# Patient Record
Sex: Female | Born: 1970
Health system: Southern US, Community
[De-identification: ages and names within clinical notes are randomized; demographics above are authoritative.]

## PROBLEM LIST (undated history)

## (undated) DIAGNOSIS — D649 Anemia, unspecified: Secondary | ICD-10-CM

## (undated) DIAGNOSIS — M1990 Unspecified osteoarthritis, unspecified site: Secondary | ICD-10-CM

## (undated) DIAGNOSIS — Z973 Presence of spectacles and contact lenses: Secondary | ICD-10-CM

## (undated) DIAGNOSIS — I10 Essential (primary) hypertension: Secondary | ICD-10-CM

## (undated) HISTORY — DX: Anemia, unspecified: D64.9

---

## 2008-09-17 HISTORY — PX: FOOT SURGERY: SHX648

## 2013-02-03 ENCOUNTER — Ambulatory Visit: Payer: Self-pay | Admitting: Family Medicine

## 2015-02-01 ENCOUNTER — Ambulatory Visit
Admission: EM | Admit: 2015-02-01 | Discharge: 2015-02-01 | Disposition: A | Discharge status: Home / Self Care | Payer: BLUE CROSS/BLUE SHIELD | Attending: Family Medicine | Admitting: Family Medicine

## 2015-02-01 DIAGNOSIS — S76112A Strain of left quadriceps muscle, fascia and tendon, initial encounter: Secondary | ICD-10-CM | POA: Diagnosis not present

## 2015-02-01 MED ORDER — IBUPROFEN 800 MG PO TABS
800.0000 mg | ORAL_TABLET | Freq: Once | ORAL | Status: AC
  Administered 2015-02-01: 800 mg via ORAL

## 2015-02-01 NOTE — ED Notes (Signed)
Pt states "my left knee has been hurting for three weeks. No known injury."

## 2015-02-01 NOTE — ED Provider Notes (Signed)
CSN: [id number]     Arrival date & time 02/01/15  1516 History   First MD Initiated Contact with Patient 02/01/15 1634     Chief Complaint  Patient presents with  . Knee Pain   (Consider location/radiation/quality/duration/timing/severity/associated sxs/prior Treatment) Patient is a 44 y.o. female presenting with knee pain and leg pain.  Knee Pain Associated symptoms: swelling   Associated symptoms: no muscle weakness   Leg Pain Location:  Leg Time since incident:  3 weeks Leg location:  L leg and L upper leg Pain details:    Quality:  Aching and cramping   Radiates to:  Does not radiate   Severity:  Mild   Onset quality:  Gradual   Duration:  3 weeks   Timing:  Intermittent   Progression:  Waxing and waning Chronicity:  New Dislocation: no   Foreign body present:  No foreign bodies Prior injury to area:  No Relieved by:  Nothing Worsened by:  Activity Associated symptoms: swelling   Associated symptoms: no muscle weakness   Risk factors: obesity    Overweight F concerned about left thigh pain present for the past three weeks. She has been working in [employer] and had increased squats and weights when she noticed aching in left anterior thigh. Continued to work out and the discomfort has continued. Uses an ocassional ibuprofen- once or twice a day but becoming anxious that it has not resolved. No fall, no trauma per patient.   History reviewed. No pertinent past medical history. History reviewed. No pertinent past surgical history. Family History  Problem Relation Age of Onset  . Diabetes Mother   . Hypertension Father    History  Substance Use Topics  . Smoking status: Former Smoker    Quit date: 01/31/2010  . Smokeless tobacco: Not on file  . Alcohol Use: Yes   OB History    Gravida Para Term Preterm AB TAB SAB Ectopic Multiple Living   3              Review of Systems  HENT: Negative.   Eyes: Negative.   Respiratory: Negative.  Negative for shortness of  breath.   Cardiovascular: Negative.   Gastrointestinal: Negative.   Endocrine: Negative.   Genitourinary: Negative.   Musculoskeletal: Positive for myalgias and gait problem.  Skin: Negative.  Negative for color change and wound.  Allergic/Immunologic: Negative.   Neurological: Negative.   Hematological: Negative.   Psychiatric/Behavioral: Negative.   All other systems reviewed and are negative.   Allergies  Review of patient's allergies indicates no known allergies.  Home Medications   Prior to Admission medications   Not on File   BP 140/90 mmHg  Pulse 78  Temp(Src) 97.3 F (36.3 C) (Tympanic)  Resp 16  Ht 5\' 5"  (1.651 m)  Wt 211 lb (95.709 kg)  BMI 35.11 kg/m2  SpO2 100%  LMP 01/16/2015 (Exact Date) Physical Exam  Constitutional:    Obese  F in accelerating exercise program.Left thigh discomfort- not knee as presented.  No ecchymosis, no heat. DTRS equal. Can toe walk, heel walk. Squat and return recreates the discomfort  HENT:  Head: Atraumatic.  Eyes: EOM are normal.  Neck: Normal range of motion. Neck supple.  Cardiovascular: Normal rate and regular rhythm.   Pulmonary/Chest: No respiratory distress.  Musculoskeletal: Normal range of motion. She exhibits tenderness.  See previous notes- left anterior thigh-strain  Neurological: She is alert.  Skin: Skin is warm and dry.    ED Course  Procedures (including critical care time) Labs Review Labs Reviewed - No data to display  Imaging Review No results found.   MDM   1. Quadriceps strain, left, initial encounter    Supportive ace wrap applied by myself and figure 8 technique taught to patient-recognizing that anatomical shape of thigh  ( 5'5"  221 ) will tend toward slippage. May use snug- not tight - if it supports and given comfort while healing. Given informational handout on Quad strain and rehab exercises.  Encourage athletic rubs for comfort- ice after gentle exercise and heat pad in the evenings  at rest. May use OTC ibuprofen more regularly as well.  If not resolved with self care in 2-3 weeks recommend seen  be seen back or consult Ortho.  Discussed results,diagnosis and treatment  plan with patient and she expresses understanding.    Jan Fireman, PA-C 02/01/15 Noble Keating, PA-C 02/01/15 Joen Laura

## 2015-02-01 NOTE — Discharge Instructions (Signed)
Please use ibuprofen 200  mg 2 tablets 3 x day with meals - or alternate with acetominophen /tylenol  500 mg 2 tablets - ice thigh after activity - use heat pad when resting at the end of the day. An athletic rub of choice is available over the counter at most pharmacies and discount stores- those with menthol usually smell like peppermint- some have Capsasian as an ingredient and they heat with hot pepper juice oils usually do not smell like the gym. Advance as you are comfortable- do not push to point of pain. Please review the handout and exercises I gave you and make gentle progress. The information below you can increase in repetitions and time slowly as you get stronger.  If you have not had relief in a month of gentle progressive care I recommend evaluation with Orthopedics of choice- see the [employer] list in your insurance company information area on computer. Please establish yourself with a PCP in the area so you have a home base for care. Please return to our care if you have questions or concerns.. You may use the Quad wrap with 6 " ace bandages as I taught you if you find it comfortable and supportive-not required- only if you like it ! Thank you for choosing [place] for your care today !  Quadriceps Strain with Rehab A strain is a tear in a muscle or the tendon that attaches the muscle to bone. A quadriceps strain is a tear in the muscles on the front of the thigh (quadriceps muscles) or their tendons. The quadriceps muscles are important for straightening the knee and bending the hip. The condition is characterized by pain, inflammation, and reduced function of these muscles. Strains are classified into three categories. Grade 1 strains cause pain, but the tendon is not lengthened. Grade 2 strains include a lengthened ligament due to the ligament being stretched or partially ruptured. With grade 2 strains there is still function, although the function may be diminished. Grade 3 strains are  characterized by a complete tear of the tendon or muscle, and function is usually impaired.  SYMPTOMS   Pain, tenderness, inflammation, and/or bruising (contusion) over the quadriceps muscles  Pain that worsens with use of the quadriceps muscles.  Muscle spasm in the thigh.  Difficulty with common tasks that involve the quadriceps muscle, such as walking.  A crackling sound (crepitation) when the tendon is moved or touched.  Loss of fullness of the muscle or bulging within the area of muscle with complete rupture. CAUSES  A strain occurs when a force is placed on the muscle or tendon that is greater than it can withstand. Common mechanisms of injury include:  Repetitive strenuous use of the quadriceps muscles. This may be due to an increase in the intensity, frequency, or duration of exercise.  Direct trauma to the quadriceps muscles or tendons. RISK INCREASES WITH:  Activities that involve forceful contractions of the quadriceps muscles (jumping or sprinting).  Contact sports (soccer or football).  Poor strength and flexibility.  Failure to warm-up properly before activity.  Previous injury to the thigh or knee. PREVENTION  Warm up and stretch properly before activity.  Allow for adequate recovery between workouts.  Maintain physical fitness:  Strength, flexibility, and endurance.  Cardiovascular fitness.  Wear properly fitted and padded protective equipment. PROGNOSIS  If treated properly, then quadriceps muscles strains are usually curable within 6 weeks.  RELATED COMPLICATIONS   Prolonged healing time, if improperly treated or re-injured.  Recurrent symptoms  that result in a chronic problem.  Recurrence of symptoms if activity is resumed too soon. TREATMENT  Treatment initially involves the use of ice and medication to help reduce pain and inflammation. The use of strengthening and stretching exercises may help reduce pain with activity. These exercises may  be performed at home or with referral to a therapist. Crutches may be recommended to allow the muscle to rest until walking can be completed without limping. Surgery is rarely necessary for this injury, but may be considered if the injury involves a grade 3 strain, or if symptoms persist for greater than 3 months despite non-surgical (conservative) treatment.  MEDICATION  If pain medication is necessary, then nonsteroidal anti-inflammatory medications, such as aspirin and ibuprofen, or other minor pain relievers, such as acetaminophen, are often recommended.  Do not take pain medication for 7 days before surgery.  Prescription pain relievers may be given if deemed necessary by your caregiver. Use only as directed and only as much as you need.  Ointments applied to the skin may be helpful.  Corticosteroid injections may be given by your caregiver. These injections should be reserved for the most serious cases, because they may only be given a certain number of times. HEAT AND COLD  Cold treatment (icing) relieves pain and reduces inflammation. Cold treatment should be applied for 10 to 15 minutes every 2 to 3 hours for inflammation and pain and immediately after any activity that aggravates your symptoms. Use ice packs or massage the area with a piece of ice (ice massage).  Heat treatment may be used prior to performing the stretching and strengthening activities prescribed by your caregiver, physical therapist, or athletic trainer. Use a heat pack or soak the injury in warm water. SEEK MEDICAL CARE IF:  Treatment seems to offer no benefit, or the condition worsens.  Any medications produce adverse side effects. EXERCISES  RANGE OF MOTION (ROM) AND STRETCHING EXERCISES - Quadriceps Strain These exercises may help you when beginning to rehabilitate your injury. Your symptoms may resolve with or without further involvement from your physician, physical therapist or athletic trainer. While  completing these exercises, remember:   Restoring tissue flexibility helps normal motion to return to the joints. This allows healthier, less painful movement and activity.  An effective stretch should be held for at least 30 seconds.  A stretch should never be painful. You should only feel a gentle lengthening or release in the stretched tissue. RANGE OF MOTION - Knee Flexion, Active  Lie on your back with both knees straight. (If this causes back discomfort, bend your opposite knee, placing your foot flat on the floor.)  Slowly slide your heel back toward your buttocks until you feel a gentle stretch in the front of your knee or thigh.  Hold for __________ seconds. Slowly slide your heel back to the starting position. Repeat __________ times. Complete this exercise __________ times per day.  STRETCH - Quadriceps, Prone  Lie on your stomach on a firm surface, such as a bed or padded floor.  Bend your right / left knee and grasp your ankle. If you are unable to reach, your ankle or pant leg, use a belt around your foot to lengthen your reach.  Gently pull your heel toward your buttocks. Your knee should not slide out to the side. You should feel a stretch in the front of your thigh and/or knee.  Hold this position for __________ seconds. Repeat __________ times. Complete this stretch __________ times per day.  STRETCHING - Hip Flexors, Lunge  Half kneel with your right / left knee on the floor and your opposite knee bent and directly over your ankle.  Keep good posture with your head over your shoulders. Tighten your buttocks to point your tailbone downward; this will prevent your back from arching too much.  You should feel a gentle stretch in the front of your thigh and/or hip. If you do not feel any resistance, slightly slide your opposite foot forward and then slowly lunge forward so your knee once again lines up over your ankle. Be sure your tailbone remains pointed  downward.  Hold this stretch for __________ seconds. Repeat __________ times. Complete this stretch __________ times per day. STRENGTHENING EXERCISES - Quadriceps Strain These exercises may help you when beginning to rehabilitate your injury. They may resolve your symptoms with or without further involvement from your physician, physical therapist or athletic trainer. While completing these exercises, remember:   Muscles can gain both the endurance and the strength needed for everyday activities through controlled exercises.  Complete these exercises as instructed by your physician, physical therapist or athletic trainer. Progress the resistance and repetitions only as guided. STRENGTH - Quadriceps, Isometrics  Lie on your back with your right / left leg extended and your opposite knee bent.  Gradually tense the muscles in the front of your right / left thigh. You should see either your knee cap slide up toward your hip or increased dimpling just above the knee. This motion will push the back of the knee down toward the floor/mat/bed on which you are lying.  Hold the muscle as tight as you can without increasing your pain for __________ seconds.  Relax the muscles slowly and completely in between each repetition. Repeat __________ times. Complete this exercise __________ times per day.  STRENGTH - Quadriceps, Short Arcs   Lie on your back. Place a __________ inch towel roll under your knee so that the knee slightly bends.  Raise only your lower leg by tightening the muscles in the front of your thigh. Do not allow your thigh to rise.  Hold this position for __________ seconds. Repeat __________ times. Complete this exercise __________ times per day.  OPTIONAL ANKLE WEIGHTS: Begin with ____________________, but DO NOT exceed ____________________. Increase in1 lb/0.5 kg increments. STRENGTH - Quadriceps, Straight Leg Raises  Quality counts! Watch for signs that the quadriceps muscle is  working to insure you are strengthening the correct muscles and not "cheating" by substituting with healthier muscles.  Lay on your back with your right / left leg extended and your opposite knee bent.  Tense the muscles in the front of your right / left thigh. You should see either your knee cap slide up or increased dimpling just above the knee. Your thigh may even quiver.  Tighten these muscles even more and raise your leg 4 to 6 inches off the floor. Hold for __________ seconds.  Keeping these muscles tense, lower your leg.  Relax the muscles slowly and completely in between each repetition. Repeat __________ times. Complete this exercise __________ times per day.  STRENGTH - Quadriceps, Wall Slides  Follow guidelines for form closely. Increased knee pain often results from poorly placed feet or knees.  Lean against a smooth wall or door and walk your feet out 18-24 inches. Place your feet hip-width apart.  Slowly slide down the wall or door until your knees bend __________ degrees.* Keep your knees over your heels, not your toes, and in line with  your hips, not falling to either side.  Hold for __________ seconds. Stand up to rest for __________ seconds in between each repetition. Repeat __________ times. Complete this exercise __________ times per day. * Your physician, physical therapist or athletic trainer will alter this angle based on your symptoms and progress. STRENGTH - Quadriceps, Step-Ups   Use a thick book, step or step stool that is __________ inches tall.  Holding a wall or counter for balance only, not support.  Slowly step-up with your right / left foot, keeping your knee in line with your hip and foot. Do not allow your knee to bend so far that you cannot see your toes.  Slowly unlock your knee and lower yourself to the starting position. Your muscles, not gravity, should lower you. Repeat __________ times. Complete this exercise __________ times per day. Document  Released: 09/03/2005 Document Revised: 11/26/2011 Document Reviewed: 12/16/2008 [facility] Patient Information 2015 [place], [place]. This information is not intended to replace advice given to you by your health care provider. Make sure you discuss any questions you have with your health care provider.

## 2015-05-18 ENCOUNTER — Ambulatory Visit
Admission: EM | Admit: 2015-05-18 | Discharge: 2015-05-18 | Disposition: A | Discharge status: Home / Self Care | Payer: BLUE CROSS/BLUE SHIELD | Attending: Family Medicine | Admitting: Family Medicine

## 2015-05-18 DIAGNOSIS — S0502XA Injury of conjunctiva and corneal abrasion without foreign body, left eye, initial encounter: Secondary | ICD-10-CM | POA: Diagnosis not present

## 2015-05-18 MED ORDER — TETRACAINE HCL 0.5 % OP SOLN: 2.0000 [drp] | Freq: Once | OPHTHALMIC | Status: DC

## 2015-05-18 MED ORDER — ERYTHROMYCIN 5 MG/GM OP OINT: 1.0000 "application " | TOPICAL_OINTMENT | Freq: Four times a day (QID) | OPHTHALMIC | Status: DC

## 2015-05-18 MED ORDER — FLUORESCEIN SODIUM 1 MG OP STRP: 1.0000 | ORAL_STRIP | Freq: Once | OPHTHALMIC | Status: DC

## 2015-05-18 NOTE — Discharge Instructions (Signed)
Use medication as prescribed. Use glasses. Do not use contacts until completed medication and improved. Avoid rubbing eyes. As discussed, follow up with your ophthalmologist tomorrow at [facility], or the above. Call today to schedule.   Return to Urgent Care or ER for increased irritation, eye pain, vision changes, new or worsening concerns.   Corneal Abrasion The cornea is the clear covering at the front and center of the eye. When looking at the colored portion of the eye (iris), you are looking through the cornea. This very thin tissue is made up of many layers. The surface layer is a single layer of cells (corneal epithelium) and is one of the most sensitive tissues in the body. If a scratch or injury causes the corneal epithelium to come off, it is called a corneal abrasion. If the injury extends to the tissues below the epithelium, the condition is called a corneal ulcer. CAUSES   Scratches.  Trauma.  Foreign body in the eye. Some people have recurrences of abrasions in the area of the original injury even after it has healed (recurrent erosion syndrome). Recurrent erosion syndrome generally improves and goes away with time. SYMPTOMS   Eye pain.  Difficulty or inability to keep the injured eye open.  The eye becomes very sensitive to light.  Recurrent erosions tend to happen suddenly, first thing in the morning, usually after waking up and opening the eye. DIAGNOSIS  Your health care provider can diagnose a corneal abrasion during an eye exam. Dye is usually placed in the eye using a drop or a small paper strip moistened by your tears. When the eye is examined with a special light, the abrasion shows up clearly because of the dye. TREATMENT   Small abrasions may be treated with antibiotic drops or ointment alone.  A pressure patch may be put over the eye. If this is done, follow your doctor's instructions for when to remove the patch. Do not drive or use machines while  the eye patch is on. Judging distances is hard to do with a patch on. If the abrasion becomes infected and spreads to the deeper tissues of the cornea, a corneal ulcer can result. This is serious because it can cause corneal scarring. Corneal scars interfere with light passing through the cornea and cause a loss of vision in the involved eye. HOME CARE INSTRUCTIONS  Use medicine or ointment as directed. Only take over-the-counter or prescription medicines for pain, discomfort, or fever as directed by your health care provider.  Do not drive or operate machinery if your eye is patched. Your ability to judge distances is impaired.  If your health care provider has given you a follow-up appointment, it is very important to keep that appointment. Not keeping the appointment could result in a severe eye infection or permanent loss of vision. If there is any problem keeping the appointment, let your health care provider know. SEEK MEDICAL CARE IF:   You have pain, light sensitivity, and a scratchy feeling in one eye or both eyes.  Your pressure patch keeps loosening up, and you can blink your eye under the patch after treatment.  Any kind of discharge develops from the eye after treatment or if the lids stick together in the morning.  You have the same symptoms in the morning as you did with the original abrasion days, weeks, or months after the abrasion healed. MAKE SURE YOU:   Understand these instructions.  Will watch your condition.  Will get help  right away if you are not doing well or get worse. Document Released: 08/31/2000 Document Revised: 09/08/2013 Document Reviewed: 05/11/2013 [facility] Patient Information 2015 [place], [place]. This information is not intended to replace advice given to you by your health care provider. Make sure you discuss any questions you have with your health care provider.

## 2015-05-18 NOTE — ED Provider Notes (Signed)
[facility] Emergency Department Provider Note  ____________________________________________  Time seen: Approximately 9:21 AM  I have reviewed the triage vital signs and the nursing notes.   HISTORY  Chief Complaint Eye Drainage   HPI Angela French is a 44 y.o. female presents for the complaint of left eye redness and irritation. Patient states onset was yesterday. Denies getting anything in her eye, foreign body, trauma. Denies vision changes. States last night felt like some irritation to left eye with some itching and states this morning eye was red and matted together with some greenish discharge. States she has not put contacts in since. Denies eye pain. Denies recent sick contacts. Denies chemical exposure.   Reports her ophthalmologist is at [facility].   History reviewed. No pertinent past medical history.  There are no active problems to display for this patient.   Past Surgical History  Procedure Laterality Date  . Foot surgery      No current outpatient prescriptions on file.  Allergies Review of patient's allergies indicates no known allergies.  Family History  Problem Relation Age of Onset  . Diabetes Mother   . Hypertension Father     Social History Social History  Substance Use Topics  . Smoking status: Former Smoker    Quit date: 01/31/2010  . Smokeless tobacco: None  . Alcohol Use: Yes    Review of Systems Constitutional: No fever/chills Eyes: No visual changes. Left eye irritation as above.  ENT: No sore throat. Cardiovascular: Denies chest pain. Respiratory: Denies shortness of breath. Gastrointestinal: No abdominal pain.  No nausea, no vomiting.  No diarrhea.  No constipation. Genitourinary: Negative for dysuria. Musculoskeletal: Negative for back pain. Skin: Negative for rash. Neurological: Negative for headaches, focal weakness or numbness.  10-point ROS otherwise  negative.  ____________________________________________   PHYSICAL EXAM:  VITAL SIGNS: ED Triage Vitals  Enc Vitals Group     BP 05/18/15 0847 138/99 mmHg     Pulse Rate 05/18/15 0847 76     Resp 05/18/15 0847 16     Temp 05/18/15 0847 97.4 F (36.3 C)     Temp Source 05/18/15 0847 Tympanic     SpO2 05/18/15 0847 100 %     Weight 05/18/15 0847 198 lb (89.812 kg)     Height 05/18/15 0847 5\' 5"  (1.651 m)     Head Cir --      Peak Flow --      Pain Score 05/18/15 0849 7     Pain Loc --      Pain Edu? --      Excl. in GC? --     Visual Acuity - Bilateral Distance: 20/20 ; R Distance: 20/20 ; L Distance: 20/20 With glasses   Constitutional: Alert and oriented. Well appearing and in no acute distress. Eyes: PERRL. EOMI. Normal limited bedside anterior ophthalmoloLeft eye mild to mod injection. Right eye conjunctivae normal. No left eye foreign body seen.  Left eye anesthesia with 2gtts of tetracaine and examined with fluorescein strip, small left corneal abrasion present at 9 o'clock. No foreign body visualized. No globe trauma. No surrounding erythema, swelling or tenderness. No exudate or discharge bilaterally.  Head: Atraumatic.  Ears: no erythema, normal TMs bilaterally.   Nose: No congestion/rhinnorhea.  Mouth/Throat: Mucous membranes are moist.  Oropharynx non-erythematous. Neck: No stridor.  No cervical spine tenderness to palpation. Hematological/Lymphatic/Immunilogical: No cervical lymphadenopathy. Cardiovascular: Normal rate, regular rhythm. Grossly normal heart sounds.  Good peripheral circulation. Respiratory: Normal respiratory effort.  No retractions. Lungs CTAB. Gastrointestinal: Soft and nontender. No distention. Normal Bowel sounds.   Musculoskeletal: No lower or upper extremity tenderness nor edema.  No joint effusions. Bilateral pedal pulses equal and easily palpated.  Neurologic:  Normal speech and language. No gross focal neurologic deficits are appreciated.  No gait instability. Skin:  Skin is warm, dry and intact. No rash noted. Psychiatric: Mood and affect are normal. Speech and behavior are normal.  ____________________________________________   LABS (all labs ordered are listed, but only abnormal results are displayed)  Labs Reviewed - No data to display  PROCEDURES  Procedure(s) performed: Procedure explained and verbal consent obtained.  Left eye anesthesia with 2gtts of tetracaine and examined with fluorescein strip, small left corneal abrasion present at 9 o'clock. No foreign body visualized. Patient tolerated well.  _________________________   INITIAL IMPRESSION / ASSESSMENT AND PLAN / ED COURSE  Pertinent labs & imaging results that were available during my care of the patient were reviewed by me and considered in my medical decision making (see chart for details).  Well appearing. No acute distress. Presents for left eye redness and irritation x one day. Denies eye pain or vision changes. Denies injury, foreign body, chemical or sick exposure, denies other changes. Small left corneal abrasion present, will treat with erythromycin ophthalmic ointment, and follow up with ophthalmology. Reports her ophthalmologist is at [facility]. Discussed return or be seen sooner for pain, vision changes, new or worsening concerns. Patient verbalized understanding and agreed to plan.  ____________________________________________   FINAL CLINICAL IMPRESSION(S) / ED DIAGNOSES  Final diagnoses:  Corneal abrasion, left, initial encounter       [name], NP 05/18/15 1053  [name], NP 05/18/15 1054

## 2015-05-18 NOTE — ED Notes (Signed)
Pt states "I started having left eye pain last night, this morning it was stuck together with drainage."

## 2016-05-16 DIAGNOSIS — M25562 Pain in left knee: Secondary | ICD-10-CM | POA: Diagnosis not present

## 2016-05-16 DIAGNOSIS — M25561 Pain in right knee: Secondary | ICD-10-CM | POA: Diagnosis not present

## 2016-05-16 DIAGNOSIS — Z791 Long term (current) use of non-steroidal anti-inflammatories (NSAID): Secondary | ICD-10-CM | POA: Diagnosis not present

## 2016-06-05 DIAGNOSIS — M25562 Pain in left knee: Secondary | ICD-10-CM | POA: Diagnosis not present

## 2016-06-05 DIAGNOSIS — M1712 Unilateral primary osteoarthritis, left knee: Secondary | ICD-10-CM | POA: Diagnosis not present

## 2016-07-03 DIAGNOSIS — M1712 Unilateral primary osteoarthritis, left knee: Secondary | ICD-10-CM | POA: Diagnosis not present

## 2016-09-17 DIAGNOSIS — M1712 Unilateral primary osteoarthritis, left knee: Secondary | ICD-10-CM

## 2016-09-17 HISTORY — DX: Unilateral primary osteoarthritis, left knee: M17.12

## 2016-11-26 ENCOUNTER — Ambulatory Visit: Payer: Self-pay | Admitting: Family Medicine

## 2016-12-11 ENCOUNTER — Ambulatory Visit: Payer: Self-pay | Admitting: Family Medicine

## 2016-12-27 ENCOUNTER — Ambulatory Visit (INDEPENDENT_AMBULATORY_CARE_PROVIDER_SITE_OTHER): Payer: BLUE CROSS/BLUE SHIELD | Admitting: Family Medicine

## 2016-12-27 ENCOUNTER — Encounter: Payer: Self-pay | Admitting: Family Medicine

## 2016-12-27 VITALS — BP 140/80 | HR 64 | Ht 65.0 in | Wt 205.0 lb

## 2016-12-27 DIAGNOSIS — Z0000 Encounter for general adult medical examination without abnormal findings: Secondary | ICD-10-CM

## 2016-12-27 DIAGNOSIS — M1991 Primary osteoarthritis, unspecified site: Secondary | ICD-10-CM | POA: Diagnosis not present

## 2016-12-27 MED ORDER — ETODOLAC 500 MG PO TABS: 500.0000 mg | ORAL_TABLET | Freq: Two times a day (BID) | ORAL | 3 refills | Status: DC

## 2016-12-27 NOTE — Progress Notes (Signed)
Name: Angela French   MRN: [id number]    DOB: 12/17/1970   Date:12/27/2016       Progress Note  Subjective  Chief Complaint  Chief Complaint  Patient presents with  . Establish Care    hasn't had a PCP  . Leg Pain    seen Dr Yves Dill in past- injected it    Patient presents for establishing care.   Leg Pain   There was no injury mechanism. The pain is present in the left knee. The quality of the pain is described as aching. The pain is at a severity of 8/10. The pain is moderate. The pain has been intermittent since onset. Pertinent negatives include no inability to bear weight, loss of motion, loss of sensation, muscle weakness, numbness or tingling. Nothing aggravates the symptoms. She has tried acetaminophen for the symptoms. The treatment provided mild relief.    No problem-specific Assessment & Plan notes found for this encounter.   No past medical history on file.  Past Surgical History:  Procedure Laterality Date  . FOOT SURGERY      Family History  Problem Relation Age of Onset  . Diabetes Mother   . Hypertension Father   . Hypertension Sister     Social History   Social History  . Marital status: Single    Spouse name: N/A  . Number of children: N/A  . Years of education: N/A   Occupational History  . Not on file.   Social History Main Topics  . Smoking status: Former Smoker    Types: Cigarettes    Quit date: 01/31/2010  . Smokeless tobacco: Never Used  . Alcohol use Yes  . Drug use: No  . Sexual activity: Yes    Birth control/ protection: None   Other Topics Concern  . Not on file   Social History Narrative  . No narrative on file    No Known Allergies  Outpatient Medications Prior to Visit  Medication Sig Dispense Refill  . erythromycin ophthalmic ointment Place 1 application into the left eye 4 (four) times daily. For seven days 3.5 g 0   No facility-administered medications prior to visit.     Review of Systems  Constitutional:  Negative for chills, fever, malaise/fatigue and weight loss.  HENT: Negative for ear discharge, ear pain and sore throat.   Eyes: Negative for blurred vision.  Respiratory: Negative for cough, sputum production, shortness of breath and wheezing.   Cardiovascular: Negative for chest pain, palpitations and leg swelling.  Gastrointestinal: Negative for abdominal pain, blood in stool, constipation, diarrhea, heartburn, melena and nausea.  Genitourinary: Negative for dysuria, frequency, hematuria and urgency.  Musculoskeletal: Positive for joint pain. Negative for back pain, myalgias and neck pain.  Skin: Negative for rash.  Neurological: Negative for dizziness, tingling, sensory change, focal weakness, numbness and headaches.  Endo/Heme/Allergies: Negative for environmental allergies and polydipsia. Does not bruise/bleed easily.  Psychiatric/Behavioral: Negative for depression and suicidal ideas. The patient is not nervous/anxious and does not have insomnia.      Objective  Vitals:   12/27/16 1448  BP: 140/80  Pulse: 64  Weight: 205 lb (93 kg)  Height: 5\' 5"  (1.651 m)    Physical Exam  Constitutional: She is well-developed, well-nourished, and in no distress. No distress.  HENT:  Head: Normocephalic and atraumatic.  Right Ear: External ear normal.  Left Ear: External ear normal.  Nose: Nose normal.  Mouth/Throat: Oropharynx is clear and moist.  Eyes: Conjunctivae and EOM are  normal. Pupils are equal, round, and reactive to light. Right eye exhibits no discharge. Left eye exhibits no discharge.  Neck: Normal range of motion. Neck supple. No JVD present. No thyromegaly present.  Cardiovascular: Normal rate, regular rhythm, normal heart sounds and intact distal pulses.  Exam reveals no gallop and no friction rub.   No murmur heard. Pulmonary/Chest: Effort normal and breath sounds normal.  Abdominal: Soft. Bowel sounds are normal. She exhibits no mass. There is no tenderness. There is  no guarding.  Musculoskeletal: Normal range of motion. She exhibits no edema.  Lymphadenopathy:    She has no cervical adenopathy.  Neurological: She is alert. She has normal reflexes.  Skin: Skin is warm and dry. She is not diaphoretic.  Psychiatric: Mood and affect normal.  Vitals reviewed.     Assessment & Plan  Problem List Items Addressed This Visit    None    Visit Diagnoses    Encounter for medical examination to establish care    -  Primary   Primary arthrosis of joint       Relevant Medications   etodolac (LODINE) 500 MG tablet   Other Relevant Orders   Ambulatory referral to Orthopedic Surgery      Meds ordered this encounter  Medications  . etodolac (LODINE) 500 MG tablet    Sig: Take 1 tablet (500 mg total) by mouth 2 (two) times daily.    Dispense:  60 tablet    Refill:  3      Dr. Otilio Miu [facility] [place] [facility]  12/27/16

## 2017-01-02 DIAGNOSIS — M1712 Unilateral primary osteoarthritis, left knee: Secondary | ICD-10-CM | POA: Diagnosis not present

## 2017-02-05 ENCOUNTER — Encounter: Payer: BLUE CROSS/BLUE SHIELD | Admitting: Family Medicine

## 2017-02-07 ENCOUNTER — Ambulatory Visit (INDEPENDENT_AMBULATORY_CARE_PROVIDER_SITE_OTHER): Payer: BLUE CROSS/BLUE SHIELD | Admitting: Family Medicine

## 2017-02-07 VITALS — BP 138/78 | HR 62 | Ht 65.0 in | Wt 207.0 lb

## 2017-02-07 DIAGNOSIS — E6609 Other obesity due to excess calories: Secondary | ICD-10-CM

## 2017-02-07 DIAGNOSIS — Z6834 Body mass index (BMI) 34.0-34.9, adult: Secondary | ICD-10-CM | POA: Diagnosis not present

## 2017-02-07 DIAGNOSIS — Z01419 Encounter for gynecological examination (general) (routine) without abnormal findings: Secondary | ICD-10-CM

## 2017-02-07 DIAGNOSIS — Z1231 Encounter for screening mammogram for malignant neoplasm of breast: Secondary | ICD-10-CM

## 2017-02-07 DIAGNOSIS — Z1239 Encounter for other screening for malignant neoplasm of breast: Secondary | ICD-10-CM

## 2017-02-07 DIAGNOSIS — R875 Abnormal microbiological findings in specimens from female genital organs: Secondary | ICD-10-CM | POA: Diagnosis not present

## 2017-02-07 DIAGNOSIS — Z124 Encounter for screening for malignant neoplasm of cervix: Secondary | ICD-10-CM | POA: Diagnosis not present

## 2017-02-07 DIAGNOSIS — R1900 Intra-abdominal and pelvic swelling, mass and lump, unspecified site: Secondary | ICD-10-CM

## 2017-02-07 DIAGNOSIS — Z0000 Encounter for general adult medical examination without abnormal findings: Secondary | ICD-10-CM | POA: Diagnosis not present

## 2017-02-07 LAB — HEMOCCULT GUIAC POC 1CARD (OFFICE): Fecal Occult Blood, POC: NEGATIVE

## 2017-02-07 LAB — POCT URINALYSIS DIPSTICK
Blood, UA: NEGATIVE
Glucose, UA: NEGATIVE
KETONES UA: NEGATIVE
Nitrite, UA: NEGATIVE
Protein, UA: NEGATIVE
Spec Grav, UA: 1.01 (ref 1.010–1.025)
Urobilinogen, UA: 0.2 E.U./dL
pH, UA: 6.5 (ref 5.0–8.0)

## 2017-02-07 NOTE — Progress Notes (Signed)
Name: Angela French   MRN: [id number]    DOB: 10/30/1970   Date:02/07/2017       Progress Note  Subjective  Chief Complaint  Chief Complaint  Patient presents with  . Annual Exam    pap and needs mammo    Patient presents for annual physical exam with pap and pelvic/breast exam.   Leg Pain   Incident onset: duration 2 years. The pain is present in the left leg. The quality of the pain is described as aching. The pain has been fluctuating since onset. Pertinent negatives include no inability to bear weight, loss of motion, loss of sensation, muscle weakness, numbness or tingling. Nothing aggravates the symptoms. The treatment provided mild relief.    No problem-specific Assessment & Plan notes found for this encounter.   No past medical history on file.  Past Surgical History:  Procedure Laterality Date  . FOOT SURGERY      Family History  Problem Relation Age of Onset  . Diabetes Mother   . Hypertension Father   . Hypertension Sister     Social History   Social History  . Marital status: Single    Spouse name: N/A  . Number of children: N/A  . Years of education: N/A   Occupational History  . Not on file.   Social History Main Topics  . Smoking status: Former Smoker    Types: Cigarettes    Quit date: 01/31/2010  . Smokeless tobacco: Never Used  . Alcohol use Yes  . Drug use: No  . Sexual activity: Yes    Birth control/ protection: None   Other Topics Concern  . Not on file   Social History Narrative  . No narrative on file    No Known Allergies  Outpatient Medications Prior to Visit  Medication Sig Dispense Refill  . etodolac (LODINE) 500 MG tablet Take 1 tablet (500 mg total) by mouth 2 (two) times daily. 60 tablet 3   No facility-administered medications prior to visit.     Review of Systems  Constitutional: Negative for chills, fever, malaise/fatigue and weight loss.  HENT: Negative for ear discharge, ear pain and sore throat.   Eyes:  Negative for blurred vision.  Respiratory: Negative for cough, sputum production, shortness of breath and wheezing.   Cardiovascular: Negative for chest pain, palpitations and leg swelling.  Gastrointestinal: Negative for abdominal pain, blood in stool, constipation, diarrhea, heartburn, melena and nausea.  Genitourinary: Negative for dysuria, frequency, hematuria and urgency.  Musculoskeletal: Negative for back pain, joint pain, myalgias and neck pain.       Leg pain  Skin: Negative for rash.  Neurological: Negative for dizziness, tingling, sensory change, focal weakness, numbness and headaches.  Endo/Heme/Allergies: Negative for environmental allergies and polydipsia. Does not bruise/bleed easily.  Psychiatric/Behavioral: Negative for depression and suicidal ideas. The patient is not nervous/anxious and does not have insomnia.      Objective  Vitals:   02/07/17 0947  BP: 138/78  Pulse: 62  Weight: 207 lb (93.9 kg)  Height: 5\' 5"  (1.651 m)    Physical Exam  Constitutional: She is oriented to person, place, and time and well-developed, well-nourished, and in no distress. No distress.  HENT:  Head: Normocephalic and atraumatic.  Right Ear: Tympanic membrane, external ear and ear canal normal.  Left Ear: Tympanic membrane, external ear and ear canal normal.  Nose: Nose normal. No mucosal edema.  Mouth/Throat: Uvula is midline, oropharynx is clear and moist and mucous membranes are  normal. No oropharyngeal exudate, posterior oropharyngeal edema or posterior oropharyngeal erythema.  Eyes: Conjunctivae and EOM are normal. Pupils are equal, round, and reactive to light. Right eye exhibits no discharge. Left eye exhibits no discharge.  Fundoscopic exam:      The right eye shows no arteriolar narrowing and no AV nicking.       The left eye shows no arteriolar narrowing and no AV nicking.  Neck: Trachea normal, normal range of motion and full passive range of motion without pain. Neck  supple. Normal carotid pulses, no hepatojugular reflux and no JVD present. Carotid bruit is not present. No neck rigidity. No edema, no erythema and normal range of motion present. No thyromegaly present.  Cardiovascular: Normal rate, regular rhythm, S1 normal, S2 normal, normal heart sounds, intact distal pulses and normal pulses.  Exam reveals no gallop and no friction rub.   No murmur heard. Pulmonary/Chest: Effort normal and breath sounds normal. She has no wheezes. She has no rales. Right breast exhibits no inverted nipple, no mass, no nipple discharge, no skin change and no tenderness. Left breast exhibits no inverted nipple, no mass, no nipple discharge, no skin change and no tenderness. Breasts are symmetrical.  Abdominal: Soft. Bowel sounds are normal. She exhibits no mass. There is no hepatosplenomegaly. There is no tenderness. There is no guarding and no CVA tenderness.  Genitourinary: Rectum normal, vagina normal, cervix normal and vulva normal. Rectal exam shows no tenderness and guaiac negative stool. Uterus is enlarged. Left adnexum displays mass. Left adnexum displays no tenderness.  Musculoskeletal: Normal range of motion. She exhibits no edema.       Lumbar back: Normal.       Arms: Lymphadenopathy:       Head (right side): No submental and no submandibular adenopathy present.       Head (left side): No submental and no submandibular adenopathy present.    She has no cervical adenopathy.    She has no axillary adenopathy.  Neurological: She is alert and oriented to person, place, and time. She has normal motor skills, normal sensation, normal strength, normal reflexes and intact cranial nerves.  Skin: Skin is warm and dry. She is not diaphoretic. No pallor.  Psychiatric: Mood and affect normal.  Nursing note and vitals reviewed.     Assessment & Plan  Problem List Items Addressed This Visit    None    Visit Diagnoses    Annual physical exam    -  Primary   Relevant  Orders   POCT occult blood stool (Completed)   POCT urinalysis dipstick (Completed)   Renal Function Panel   Lipid Profile   Pap IG and HPV (high risk) DNA detection   Encounter for gynecological examination with Papanicolaou smear of cervix       Relevant Orders   Pap IG and HPV (high risk) DNA detection   Pelvic mass in female       Relevant Orders   US Pelvis Complete   US Transvaginal Non-OB   Breast screening       Relevant Orders   MM Digital Screening   Cervical cancer screening       Relevant Orders   Pap IG and HPV (high risk) DNA detection   Class 1 obesity due to excess calories without serious comorbidity with body mass index (BMI) of 34.0 to 34.9 in adult       Relevant Orders   Renal Function Panel   Lipid Profile  Hyperbilirubinemia       Relevant Orders   Renal Function Panel   Lipid Profile   Hepatic function panel      No orders of the defined types were placed in this encounter.     Dr. [facility]  02/07/17

## 2017-02-08 LAB — HEPATIC FUNCTION PANEL
ALK PHOS: 65 IU/L (ref 39–117)
ALT: 15 IU/L (ref 0–32)
AST: 20 IU/L (ref 0–40)
BILIRUBIN, DIRECT: 0.13 mg/dL (ref 0.00–0.40)
Bilirubin Total: 0.5 mg/dL (ref 0.0–1.2)
TOTAL PROTEIN: 7.9 g/dL (ref 6.0–8.5)

## 2017-02-08 LAB — RENAL FUNCTION PANEL
Albumin: 4.4 g/dL (ref 3.5–5.5)
BUN/Creatinine Ratio: 11 (ref 9–23)
BUN: 9 mg/dL (ref 6–24)
CHLORIDE: 101 mmol/L (ref 96–106)
CO2: 23 mmol/L (ref 18–29)
Calcium: 9.2 mg/dL (ref 8.7–10.2)
Creatinine, Ser: 0.84 mg/dL (ref 0.57–1.00)
GFR calc non Af Amer: 85 mL/min/{1.73_m2} (ref >59)
GFR, EST AFRICAN AMERICAN: 98 mL/min/{1.73_m2} (ref >59)
GLUCOSE: 91 mg/dL (ref 65–99)
PHOSPHORUS: 3.3 mg/dL (ref 2.5–4.5)
POTASSIUM: 4.8 mmol/L (ref 3.5–5.2)
SODIUM: 139 mmol/L (ref 134–144)

## 2017-02-08 LAB — LIPID PANEL
Chol/HDL Ratio: 1.9 ratio (ref 0.0–4.4)
Cholesterol, Total: 126 mg/dL (ref 100–199)
HDL: 65 mg/dL (ref >39)
LDL Calculated: 42 mg/dL (ref 0–99)
TRIGLYCERIDES: 94 mg/dL (ref 0–149)
VLDL Cholesterol Cal: 19 mg/dL (ref 5–40)

## 2017-02-12 ENCOUNTER — Ambulatory Visit
Admission: RE | Admit: 2017-02-12 | Discharge: 2017-02-12 | Disposition: A | Discharge status: Home / Self Care | Payer: BLUE CROSS/BLUE SHIELD | Source: Ambulatory Visit | Attending: Family Medicine | Admitting: Family Medicine

## 2017-02-12 ENCOUNTER — Other Ambulatory Visit: Payer: Self-pay

## 2017-02-12 DIAGNOSIS — R1900 Intra-abdominal and pelvic swelling, mass and lump, unspecified site: Secondary | ICD-10-CM | POA: Diagnosis not present

## 2017-02-12 DIAGNOSIS — A599 Trichomoniasis, unspecified: Secondary | ICD-10-CM

## 2017-02-12 DIAGNOSIS — D259 Leiomyoma of uterus, unspecified: Secondary | ICD-10-CM | POA: Insufficient documentation

## 2017-02-12 LAB — PAP IG AND HPV HIGH-RISK
HPV, HIGH-RISK: NEGATIVE
PAP Smear Comment: 0

## 2017-02-12 MED ORDER — METRONIDAZOLE 500 MG PO TABS: 500.0000 mg | ORAL_TABLET | Freq: Every day | ORAL | 0 refills | Status: DC

## 2017-02-14 ENCOUNTER — Other Ambulatory Visit: Payer: Self-pay

## 2017-02-14 ENCOUNTER — Encounter: Payer: BLUE CROSS/BLUE SHIELD | Admitting: Family Medicine

## 2017-02-14 DIAGNOSIS — R935 Abnormal findings on diagnostic imaging of other abdominal regions, including retroperitoneum: Secondary | ICD-10-CM

## 2017-02-18 ENCOUNTER — Inpatient Hospital Stay
Admission: RE | Admit: 2017-02-18 | Discharge: 2017-02-18 | Disposition: A | Discharge status: Home / Self Care | Payer: Self-pay | Source: Ambulatory Visit | Attending: *Deleted | Admitting: *Deleted

## 2017-02-18 ENCOUNTER — Other Ambulatory Visit: Payer: Self-pay | Admitting: *Deleted

## 2017-02-18 DIAGNOSIS — Z9289 Personal history of other medical treatment: Secondary | ICD-10-CM

## 2017-02-21 ENCOUNTER — Telehealth: Payer: Self-pay | Admitting: Obstetrics & Gynecology

## 2017-02-21 NOTE — Telephone Encounter (Signed)
Pt is being referred by [facility] for abnormal ultrasound of uterus. I called and left voicemail for patient to call back to be schedule

## 2017-02-22 NOTE — Telephone Encounter (Signed)
Lvm for pt to call to be schedule

## 2017-02-26 NOTE — Telephone Encounter (Signed)
LVm for patient to call back to be schedule

## 2017-02-27 ENCOUNTER — Telehealth: Payer: Self-pay

## 2017-02-27 ENCOUNTER — Other Ambulatory Visit: Payer: Self-pay

## 2017-02-27 NOTE — Telephone Encounter (Signed)
Spoke to pt concerning the 3 attempts that [facility] has made to contact her for appt scheduling. I told her that this referral is only good for so long and she must call their office today to schedule the follow up. Pt said,"I' will, I'll call today"

## 2017-03-01 ENCOUNTER — Other Ambulatory Visit: Payer: Self-pay | Admitting: Orthopedic Surgery

## 2017-03-01 DIAGNOSIS — M1712 Unilateral primary osteoarthritis, left knee: Secondary | ICD-10-CM | POA: Diagnosis not present

## 2017-03-01 DIAGNOSIS — M2352 Chronic instability of knee, left knee: Secondary | ICD-10-CM | POA: Diagnosis not present

## 2017-03-01 DIAGNOSIS — M2392 Unspecified internal derangement of left knee: Secondary | ICD-10-CM | POA: Diagnosis not present

## 2017-03-04 ENCOUNTER — Ambulatory Visit
Admission: RE | Admit: 2017-03-04 | Discharge: 2017-03-04 | Disposition: A | Discharge status: Home / Self Care | Payer: BLUE CROSS/BLUE SHIELD | Source: Ambulatory Visit | Attending: Family Medicine | Admitting: Family Medicine

## 2017-03-04 DIAGNOSIS — Z1231 Encounter for screening mammogram for malignant neoplasm of breast: Secondary | ICD-10-CM | POA: Insufficient documentation

## 2017-03-04 DIAGNOSIS — Z1239 Encounter for other screening for malignant neoplasm of breast: Secondary | ICD-10-CM

## 2017-03-04 NOTE — Telephone Encounter (Signed)
Left voicemail with Lexine Baton about Referred patient and being unbale to reach patient to schedule appt.

## 2017-03-07 ENCOUNTER — Ambulatory Visit
Admission: RE | Admit: 2017-03-07 | Discharge: 2017-03-07 | Disposition: A | Discharge status: Home / Self Care | Payer: BLUE CROSS/BLUE SHIELD | Source: Ambulatory Visit | Attending: Orthopedic Surgery | Admitting: Orthopedic Surgery

## 2017-03-07 DIAGNOSIS — X58XXXA Exposure to other specified factors, initial encounter: Secondary | ICD-10-CM | POA: Diagnosis not present

## 2017-03-07 DIAGNOSIS — M2352 Chronic instability of knee, left knee: Secondary | ICD-10-CM | POA: Insufficient documentation

## 2017-03-07 DIAGNOSIS — M1712 Unilateral primary osteoarthritis, left knee: Secondary | ICD-10-CM | POA: Diagnosis not present

## 2017-03-07 DIAGNOSIS — M25562 Pain in left knee: Secondary | ICD-10-CM | POA: Diagnosis not present

## 2017-03-07 DIAGNOSIS — M25462 Effusion, left knee: Secondary | ICD-10-CM | POA: Diagnosis not present

## 2017-03-07 DIAGNOSIS — M948X6 Other specified disorders of cartilage, lower leg: Secondary | ICD-10-CM | POA: Insufficient documentation

## 2017-03-07 DIAGNOSIS — M2392 Unspecified internal derangement of left knee: Secondary | ICD-10-CM | POA: Insufficient documentation

## 2017-03-15 NOTE — Telephone Encounter (Deleted)
.  ws

## 2017-03-29 DIAGNOSIS — M23204 Derangement of unspecified medial meniscus due to old tear or injury, left knee: Secondary | ICD-10-CM | POA: Diagnosis not present

## 2017-03-29 DIAGNOSIS — M1712 Unilateral primary osteoarthritis, left knee: Secondary | ICD-10-CM | POA: Diagnosis not present

## 2017-04-09 ENCOUNTER — Encounter: Payer: Self-pay | Admitting: *Deleted

## 2017-04-12 ENCOUNTER — Encounter
Admission: RE | Disposition: A | Discharge status: Home / Self Care | Payer: Self-pay | Source: Ambulatory Visit | Attending: Unknown Physician Specialty

## 2017-04-12 ENCOUNTER — Ambulatory Visit: Payer: BLUE CROSS/BLUE SHIELD | Admitting: Anesthesiology

## 2017-04-12 ENCOUNTER — Ambulatory Visit
Admission: RE | Admit: 2017-04-12 | Discharge: 2017-04-12 | Disposition: A | Discharge status: Home / Self Care | Payer: BLUE CROSS/BLUE SHIELD | Source: Ambulatory Visit | Attending: Unknown Physician Specialty | Admitting: Unknown Physician Specialty

## 2017-04-12 DIAGNOSIS — M94262 Chondromalacia, left knee: Secondary | ICD-10-CM | POA: Diagnosis not present

## 2017-04-12 DIAGNOSIS — Z87891 Personal history of nicotine dependence: Secondary | ICD-10-CM | POA: Diagnosis not present

## 2017-04-12 DIAGNOSIS — M1712 Unilateral primary osteoarthritis, left knee: Secondary | ICD-10-CM | POA: Diagnosis not present

## 2017-04-12 DIAGNOSIS — M23232 Derangement of other medial meniscus due to old tear or injury, left knee: Secondary | ICD-10-CM | POA: Diagnosis not present

## 2017-04-12 DIAGNOSIS — M23222 Derangement of posterior horn of medial meniscus due to old tear or injury, left knee: Secondary | ICD-10-CM | POA: Insufficient documentation

## 2017-04-12 HISTORY — PX: KNEE ARTHROSCOPY: SHX127

## 2017-04-12 HISTORY — DX: Presence of spectacles and contact lenses: Z97.3

## 2017-04-12 HISTORY — DX: Unspecified osteoarthritis, unspecified site: M19.90

## 2017-04-12 SURGERY — ARTHROSCOPY, KNEE
Anesthesia: General | Laterality: Left | Wound class: Clean

## 2017-04-12 MED ORDER — FENTANYL CITRATE (PF) 100 MCG/2ML IJ SOLN: 25.0000 ug | INTRAMUSCULAR | Status: DC | PRN

## 2017-04-12 MED ORDER — OXYCODONE HCL 5 MG/5ML PO SOLN: 5.0000 mg | Freq: Once | ORAL | Status: AC | PRN

## 2017-04-12 MED ORDER — KETOROLAC TROMETHAMINE 30 MG/ML IJ SOLN
INTRAMUSCULAR | Status: DC | PRN
  Administered 2017-04-12: 30 mg via INTRAVENOUS

## 2017-04-12 MED ORDER — PROPOFOL 10 MG/ML IV BOLUS
INTRAVENOUS | Status: DC | PRN
  Administered 2017-04-12: 200 mg via INTRAVENOUS

## 2017-04-12 MED ORDER — LIDOCAINE HCL (CARDIAC) 20 MG/ML IV SOLN
INTRAVENOUS | Status: DC | PRN
  Administered 2017-04-12: 50 mg via INTRATRACHEAL

## 2017-04-12 MED ORDER — ONDANSETRON HCL 4 MG/2ML IJ SOLN: 4.0000 mg | Freq: Once | INTRAMUSCULAR | Status: DC | PRN

## 2017-04-12 MED ORDER — ROPIVACAINE HCL 5 MG/ML IJ SOLN
INTRAMUSCULAR | Status: DC | PRN
  Administered 2017-04-12: 15 mL

## 2017-04-12 MED ORDER — ONDANSETRON HCL 4 MG/2ML IJ SOLN
INTRAMUSCULAR | Status: DC | PRN
  Administered 2017-04-12: 4 mg via INTRAVENOUS

## 2017-04-12 MED ORDER — DEXAMETHASONE SODIUM PHOSPHATE 4 MG/ML IJ SOLN
INTRAMUSCULAR | Status: DC | PRN
  Administered 2017-04-12: 4 mg via INTRAVENOUS

## 2017-04-12 MED ORDER — MIDAZOLAM HCL 5 MG/5ML IJ SOLN
INTRAMUSCULAR | Status: DC | PRN
  Administered 2017-04-12: 2 mg via INTRAVENOUS

## 2017-04-12 MED ORDER — OXYCODONE HCL 5 MG PO TABS
5.0000 mg | ORAL_TABLET | Freq: Once | ORAL | Status: AC | PRN
  Administered 2017-04-12: 5 mg via ORAL

## 2017-04-12 MED ORDER — LACTATED RINGERS IV SOLN
INTRAVENOUS | Status: DC
  Administered 2017-04-12: 08:00:00 via INTRAVENOUS

## 2017-04-12 MED ORDER — NORCO 5-325 MG PO TABS: 1.0000 | ORAL_TABLET | ORAL | 0 refills | Status: DC | PRN

## 2017-04-12 MED ORDER — FENTANYL CITRATE (PF) 100 MCG/2ML IJ SOLN
INTRAMUSCULAR | Status: DC | PRN
  Administered 2017-04-12 (×2): 50 ug via INTRAVENOUS

## 2017-04-12 SURGICAL SUPPLY — 52 items
ARTHROWAND PARAGON T2 (SURGICAL WAND)
BLADE ABRADER 4.5 (BLADE) ×2 IMPLANT
BLADE FULL RADIUS 3.5 (BLADE) ×2 IMPLANT
BLADE SHAVER 4.5X7 STR FR (MISCELLANEOUS) IMPLANT
BLADE SHAVER AGGRES 5.5  STR (CUTTER)
BLADE SHAVER AGGRES 5.5 STR (CUTTER) IMPLANT
BNDG ESMARK 6X12 TAN STRL LF (GAUZE/BANDAGES/DRESSINGS) IMPLANT
BUR 5.5 NOTCHBLASTER STR (BURR) IMPLANT
BUR ABRADER 4.0 W/FLUTE AQUA (MISCELLANEOUS) IMPLANT
BUR ABRADER 5.5 BLK (MISCELLANEOUS) IMPLANT
BUR ACROMIONIZER 4.0 (BURR) IMPLANT
BUR BR 5.5 WIDE MOUTH (BURR) IMPLANT
BUR RADIUS 3.5 (BURR) IMPLANT
BUR RADIUS 4.0X18.5 (BURR) IMPLANT
BUR ROUND 5.5 (BURR) IMPLANT
BURR 5.5 NOTCHBLASTER STR (BURR)
BURR ABRADER 4.0 W/FLUTE AQUA (MISCELLANEOUS)
BURR ABRADER 5.5 BLK (MISCELLANEOUS)
BURR ROUND 12 FLUTE 4.0MM (BURR) IMPLANT
CANNULA THRD 8.5X72 DISP (CANNULA) IMPLANT
COVER LIGHT HANDLE UNIVERSAL (MISCELLANEOUS) ×4 IMPLANT
CUFF TOURN SGL QUICK 30 (MISCELLANEOUS)
CUFF TOURN SGL QUICK 34 (TOURNIQUET CUFF) ×1
CUFF TRNQT CYL 34X4X40X1 (TOURNIQUET CUFF) ×1 IMPLANT
CUFF TRNQT CYL LO 30X4X (MISCELLANEOUS) IMPLANT
CUTTER SLOTTED WHISKER 4.0 (BURR) IMPLANT
DRAPE LEGGINS SURG 28X43 STRL (DRAPES) ×2 IMPLANT
DURAPREP 26ML APPLICATOR (WOUND CARE) ×2 IMPLANT
GAUZE SPONGE 4X4 12PLY STRL (GAUZE/BANDAGES/DRESSINGS) ×2 IMPLANT
GLOVE BIO SURGEON STRL SZ7.5 (GLOVE) ×2 IMPLANT
GLOVE BIO SURGEON STRL SZ8 (GLOVE) ×2 IMPLANT
GLOVE INDICATOR 8.0 STRL GRN (GLOVE) ×2 IMPLANT
GOWN STRL REIN 2XL XLG LVL4 (GOWN DISPOSABLE) ×4 IMPLANT
GOWN STRL REUS W/TWL 2XL LVL3 (GOWN DISPOSABLE) IMPLANT
IV LACTATED RINGER IRRG 3000ML (IV SOLUTION) ×2
IV LR IRRIG 3000ML ARTHROMATIC (IV SOLUTION) ×2 IMPLANT
KIT ROOM TURNOVER OR (KITS) ×2 IMPLANT
MANIFOLD 4PT FOR NEPTUNE1 (MISCELLANEOUS) ×2 IMPLANT
PACK ARTHROSCOPY KNEE (MISCELLANEOUS) ×2 IMPLANT
SET TUBE SUCT SHAVER OUTFL 24K (TUBING) ×2 IMPLANT
SOL PREP PVP 2OZ (MISCELLANEOUS) ×2
SOLUTION PREP PVP 2OZ (MISCELLANEOUS) ×1 IMPLANT
SUT ETHILON 3-0 FS-10 30 BLK (SUTURE) ×2
SUTURE EHLN 3-0 FS-10 30 BLK (SUTURE) ×1 IMPLANT
TAPE MICROFOAM 4IN (TAPE) ×2 IMPLANT
TUBING ARTHRO INFLOW-ONLY STRL (TUBING) ×2 IMPLANT
WAND ARTHRO PARAGON T2 (SURGICAL WAND) IMPLANT
WAND COBLATION FLOW 50 (SURGICAL WAND) ×2 IMPLANT
WAND HAND CNTRL MULTIVAC 50 (MISCELLANEOUS) IMPLANT
WAND HAND CNTRL MULTIVAC 90 (MISCELLANEOUS) IMPLANT
WAND MEGAVAC 90 (MISCELLANEOUS) IMPLANT
WRAP KNEE W/COLD PACKS 25.5X14 (SOFTGOODS) ×2 IMPLANT

## 2017-04-12 NOTE — Anesthesia Postprocedure Evaluation (Signed)
Anesthesia Post Note  Patient: Angela French  Procedure(s) Performed: Procedure(s) (LRB): ARTHROSCOPY KNEE with chonadroplasty with partial menisectomy (Left)  Patient location during evaluation: PACU Anesthesia Type: General Level of consciousness: awake and alert Pain management: pain level controlled Vital Signs Assessment: post-procedure vital signs reviewed and stable Respiratory status: spontaneous breathing, nonlabored ventilation, respiratory function stable and patient connected to nasal cannula oxygen Cardiovascular status: blood pressure returned to baseline and stable Postop Assessment: no signs of nausea or vomiting Anesthetic complications: no    Alisa Graff

## 2017-04-12 NOTE — Transfer of Care (Signed)
Immediate Anesthesia Transfer of Care Note  Patient: Angela French  Procedure(s) Performed: Procedure(s): ARTHROSCOPY KNEE with chonadroplasty with partial menisectomy (Left)  Patient Location: PACU  Anesthesia Type: General  Level of Consciousness: awake, alert  and patient cooperative  Airway and Oxygen Therapy: Patient Spontanous Breathing and Patient connected to supplemental oxygen  Post-op Assessment: Post-op Vital signs reviewed, Patient's Cardiovascular Status Stable, Respiratory Function Stable, Patent Airway and No signs of Nausea or vomiting  Post-op Vital Signs: Reviewed and stable  Complications: No apparent anesthesia complications

## 2017-04-12 NOTE — H&P (Signed)
  H and P reviewed. No changes. Uploaded at later date. 

## 2017-04-12 NOTE — Anesthesia Procedure Notes (Signed)
Procedure Name: LMA Insertion Date/Time: 04/12/2017 8:41 AM Performed by: Londell Moh Pre-anesthesia Checklist: Patient identified, Emergency Drugs available, Suction available, Timeout performed and Patient being monitored Patient Re-evaluated:Patient Re-evaluated prior to induction Oxygen Delivery Method: Circle system utilized Preoxygenation: Pre-oxygenation with 100% oxygen Induction Type: IV induction LMA: LMA inserted LMA Size: 4.0 Number of attempts: 1 Placement Confirmation: positive ETCO2 and breath sounds checked- equal and bilateral Tube secured with: Tape

## 2017-04-12 NOTE — Op Note (Signed)
Patient: Angela French, Angela French.  Preoperative diagnosis: Torn medial meniscus left knee plus medial compartment chondral pathology  Postop diagnosis: Same  Operation: Arthroscopic partial medial meniscectomy plus chondral debridement medial femoral condyle left knee  Surgeon: Vilinda Flake, MD  Anesthesia: Gen.   History: Patient's had a long history of left knee pain.  The plain films revealed some narrowing of the left knee medial compartment .  The patient had an MRI which revealed a complex tear of the posterior horn of the left knee medial meniscus plus grade 2-3 medial compartment chondral changes.The patient was scheduled for surgery due to persistent discomfort despite conservative treatment.  The patient was taken the operating room where satisfactory general anesthesia was achieved. A tourniquet and leg holder were was applied to the left thigh. A well leg support was applied to the nonoperative extremity. The left knee was prepped and draped in usual fashion for an arthroscopic procedure. An inflow cannula was introduced superomedially. The joint was distended with lactated Ringer's. Scope was introduced through an inferolateral puncture wound and a probe through an inferomedial puncture wound. Inspection of the medial compartment revealed a complex tear of the posterior horn of the medial meniscus plus grade 2-3 medial femoral chondral changes. I went ahead and resected the posterior horn tears using a combination of basket biters and a motorized resector. I then debrided the medial femoral chondral changes with a turbo whisker and coblated the most significant chondral pathology with an ArthroCare radiofrequency wand. Inspection of the intercondylar notch revealed intact cruciates. Inspection of the the lateral compartment revealed no meniscal or chondral pathology.   Trochlear groove was inspected and appeared to be fairly smooth.  Inspection of the retropatellar surface revealed no  significant chondral changes. I observed patella tracking from the inferolateral portal. The patella seemed to track fairly well.  The instruments were removed from the joint at this time. The puncture wounds were closed with 3-0 nylon in vertical mattress fashion. I injected each puncture wound with several cc of half percent Marcaine without epinephrine. Betadine was applied to the wounds followed by sterile dressing. An ice pack was applied to the left knee. The patient was awakened and transferred to the stretcher bed. The patient was taken to the recovery room in satisfactory condition.  The tourniquet was not inflated during the course of the procedure. Blood loss was negligible.

## 2017-04-12 NOTE — Anesthesia Preprocedure Evaluation (Signed)
Anesthesia Evaluation  Patient identified by MRN, date of birth, ID band Patient awake    Reviewed: Allergy & Precautions, H&P , NPO status , Patient's Chart, lab work & pertinent test results, reviewed documented beta blocker date and time   Airway Mallampati: II  TM Distance: >3 FB Neck ROM: full    Dental no notable dental hx.    Pulmonary neg pulmonary ROS, former smoker,    Pulmonary exam normal breath sounds clear to auscultation       Cardiovascular Exercise Tolerance: Good negative cardio ROS   Rhythm:regular Rate:Normal     Neuro/Psych negative neurological ROS  negative psych ROS   GI/Hepatic negative GI ROS, Neg liver ROS,   Endo/Other  negative endocrine ROS  Renal/GU negative Renal ROS  negative genitourinary   Musculoskeletal   Abdominal   Peds  Hematology negative hematology ROS (+)   Anesthesia Other Findings   Reproductive/Obstetrics negative OB ROS                             Anesthesia Physical Anesthesia Plan  ASA: I  Anesthesia Plan: General   Post-op Pain Management:    Induction:   PONV Risk Score and Plan:   Airway Management Planned:   Additional Equipment:   Intra-op Plan:   Post-operative Plan:   Informed Consent: I have reviewed the patients History and Physical, chart, labs and discussed the procedure including the risks, benefits and alternatives for the proposed anesthesia with the patient or authorized representative who has indicated his/her understanding and acceptance.   Dental Advisory Given  Plan Discussed with: CRNA  Anesthesia Plan Comments:         Anesthesia Quick Evaluation

## 2017-04-12 NOTE — Discharge Instructions (Signed)
° °[facility]  °Ang[facility] B. Savon Bordonaro, Jr., M.D. [phone]  ° °KNEE ARTHROSCOPY POST OPERATION INSTRUCTIONS: ° °PLEASE READ THESE INSTRUCTIONS ABOUT POST OPERATION CARE. THEY WILL ANSWER MOST OF YOUR QUESTIONS.  °You have been given a prescription for pain. Please take as directed for pain.  °You can walk, keeping the knee slightly stiff-avoid doing too much bending the first day. (if ACL reconstruction is performed, keep brace locked in extension when walking.)  °You will use crutches or cane if needed. Can weight bear as tolerated  °Plan to take three to four days off from work. You can resume work when you are comfortable. (This can be a week or more, depending on the type of work you do.)  °To reduce pain and swelling, place one to two pillows under the knee the first two or three days when sitting or lying. An ice pack may be placed on top of the area over the dressing. Instructions for making homemade icepack are as follow:  °Flexible homemade alcohol water ice pack  °2 cups water  °1 cup rubbing alcohol  °food coloring for the blue tint (optional)  °2 zip-top bags - gallon-size  °Mix the water and alcohol together in one of your zip-top bags and add food coloring. Release as much air as possible and seal the bag. Place in freezer for at least 12 hours.  °The small incisions in your knee are closed with nylon stitches. They will be removed in the office.  °The bulky dressing may be removed in the third day after surgery. (If ACL surgery-DO NOT REMOVE BANDAGES). Put a waterproof band-aid over each stitch. Do not put any creams or ointments on wounds. You may shower at this time, but change waterproof band-aids after showering. KEEP INCISIONS CLEAN AND DRY UNTIL YOU RETURN TO THE OFFICE.  °Sometimes the operative area remains somewhat painful and swollen for several weeks. This is usually nothing to worry about, but call if you have any excessive symptoms, especially  fever. It is not unusual to have a low grade fever of 99 degrees for the first few days. If persist after 3-4 days call the office. It is not uncommon for the pain to be a little worse on the third day after surgery.  °Begin doing gentle exercises right away. They will be limited by the amount of pain and swelling you have.  Exercising will reduce the swelling, increase motion, and prevent muscle weakness. Exercises: Straight leg raising and gentle knee bending.  °Take a 325 milligram aspirin or Bufferin tablet twice a day for 2 weeks after meals or milk. This along with elevation will help reduce the possibility of phlebitis in your operated leg.  °Avoid strenuous athletics for a minimum of 4 to 6 weeks after arthroscopic surgery (approximately five months if ACL surgery).  °If the surgery included ACL reconstruction the brace that is supplied to the extremity post surgery is to be locked in extension when you are asleep and is to be locked in extension when you are ambulating. It can be unlocked for exercises or sitting.  °Keep your post surgery appointment that has been made for you. If you do not remember the date call [phone]. Your follow up appointment should be between 7-10 days.  °General Anesthesia, Adult, Care After °These instructions provide you with information about caring for yourself after your procedure. Your health care provider may also give you more specific instructions. Your treatment has been planned according to current   medical practices, but problems sometimes occur. Call your health care provider if you have any problems or questions after your procedure. °What can I expect after the procedure? °After the procedure, it is common to have: °· Vomiting. °· A sore throat. °· Mental slowness. ° °It is common to feel: °· Nauseous. °· Cold or shivery. °· Sleepy. °· Tired. °· Sore or achy, even in parts of your body where you did not have surgery. ° °Follow these instructions at home: °For at  least 24 hours after the procedure: °· Do not: °? Participate in activities where you could fall or become injured. °? Drive. °? Use heavy machinery. °? Drink alcohol. °? Take sleeping pills or medicines that cause drowsiness. °? Make important decisions or sign legal documents. °? Take care of children on your own. °· Rest. °Eating and drinking °· If you vomit, drink water, juice, or soup when you can drink without vomiting. °· Drink enough fluid to keep your urine clear or pale yellow. °· Make sure you have little or no nausea before eating solid foods. °· Follow the diet recommended by your health care provider. °General instructions °· Have a responsible adult stay with you until you are awake and alert. °· Return to your normal activities as told by your health care provider. Ask your health care provider what activities are safe for you. °· Take over-the-counter and prescription medicines only as told by your health care provider. °· If you smoke, do not smoke without supervision. °· Keep all follow-up visits as told by your health care provider. This is important. °Contact a health care provider if: °· You continue to have nausea or vomiting at home, and medicines are not helpful. °· You cannot drink fluids or start eating again. °· You cannot urinate after 8-12 hours. °· You develop a skin rash. °· You have fever. °· You have increasing redness at the site of your procedure. °Get help right away if: °· You have difficulty breathing. °· You have chest pain. °· You have unexpected bleeding. °· You feel that you are having a life-threatening or urgent problem. °This information is not intended to replace advice given to you by your health care provider. Make sure you discuss any questions you have with your health care provider. °Document Released: 12/10/2000 Document Revised: 02/06/2016 Document Reviewed: 08/18/2015 °Elsevier Interactive Patient Education © 2018 [employer] ° °

## 2017-04-15 ENCOUNTER — Encounter: Payer: Self-pay | Admitting: Unknown Physician Specialty

## 2017-04-25 ENCOUNTER — Other Ambulatory Visit: Payer: Self-pay

## 2017-04-25 DIAGNOSIS — M1991 Primary osteoarthritis, unspecified site: Secondary | ICD-10-CM

## 2017-04-25 MED ORDER — ETODOLAC 500 MG PO TABS: 500.0000 mg | ORAL_TABLET | Freq: Two times a day (BID) | ORAL | 1 refills | Status: DC

## 2017-05-14 DIAGNOSIS — M2392 Unspecified internal derangement of left knee: Secondary | ICD-10-CM | POA: Diagnosis not present

## 2017-07-14 ENCOUNTER — Other Ambulatory Visit: Payer: Self-pay | Admitting: Family Medicine

## 2017-07-14 DIAGNOSIS — M1991 Primary osteoarthritis, unspecified site: Secondary | ICD-10-CM

## 2017-07-26 DIAGNOSIS — Z9889 Other specified postprocedural states: Secondary | ICD-10-CM | POA: Diagnosis not present

## 2017-07-26 DIAGNOSIS — E669 Obesity, unspecified: Secondary | ICD-10-CM | POA: Insufficient documentation

## 2017-07-26 DIAGNOSIS — M25562 Pain in left knee: Secondary | ICD-10-CM | POA: Diagnosis not present

## 2017-07-26 DIAGNOSIS — M1712 Unilateral primary osteoarthritis, left knee: Secondary | ICD-10-CM | POA: Diagnosis not present

## 2017-07-31 DIAGNOSIS — M1712 Unilateral primary osteoarthritis, left knee: Secondary | ICD-10-CM | POA: Diagnosis not present

## 2017-07-31 DIAGNOSIS — E669 Obesity, unspecified: Secondary | ICD-10-CM | POA: Diagnosis not present

## 2017-07-31 DIAGNOSIS — Z9889 Other specified postprocedural states: Secondary | ICD-10-CM | POA: Diagnosis not present

## 2017-08-22 DIAGNOSIS — M1712 Unilateral primary osteoarthritis, left knee: Secondary | ICD-10-CM | POA: Diagnosis not present

## 2017-09-25 DIAGNOSIS — H169 Unspecified keratitis: Secondary | ICD-10-CM | POA: Diagnosis not present

## 2017-10-15 DIAGNOSIS — M1712 Unilateral primary osteoarthritis, left knee: Secondary | ICD-10-CM | POA: Diagnosis not present

## 2017-10-15 DIAGNOSIS — E669 Obesity, unspecified: Secondary | ICD-10-CM | POA: Diagnosis not present

## 2017-11-27 ENCOUNTER — Other Ambulatory Visit: Payer: Self-pay | Admitting: Unknown Physician Specialty

## 2017-11-27 DIAGNOSIS — M1732 Unilateral post-traumatic osteoarthritis, left knee: Secondary | ICD-10-CM

## 2017-12-09 ENCOUNTER — Ambulatory Visit
Admission: RE | Admit: 2017-12-09 | Discharge: 2017-12-09 | Disposition: A | Discharge status: Home / Self Care | Payer: BLUE CROSS/BLUE SHIELD | Source: Ambulatory Visit | Attending: Unknown Physician Specialty | Admitting: Unknown Physician Specialty

## 2017-12-09 DIAGNOSIS — M25469 Effusion, unspecified knee: Secondary | ICD-10-CM | POA: Diagnosis not present

## 2017-12-09 DIAGNOSIS — M25462 Effusion, left knee: Secondary | ICD-10-CM | POA: Insufficient documentation

## 2017-12-09 DIAGNOSIS — M23204 Derangement of unspecified medial meniscus due to old tear or injury, left knee: Secondary | ICD-10-CM | POA: Insufficient documentation

## 2017-12-09 DIAGNOSIS — M659 Synovitis and tenosynovitis, unspecified: Secondary | ICD-10-CM | POA: Diagnosis not present

## 2017-12-09 DIAGNOSIS — D1622 Benign neoplasm of long bones of left lower limb: Secondary | ICD-10-CM | POA: Insufficient documentation

## 2017-12-09 DIAGNOSIS — M1732 Unilateral post-traumatic osteoarthritis, left knee: Secondary | ICD-10-CM | POA: Diagnosis not present

## 2017-12-09 DIAGNOSIS — Z9889 Other specified postprocedural states: Secondary | ICD-10-CM | POA: Insufficient documentation

## 2017-12-09 DIAGNOSIS — M238X2 Other internal derangements of left knee: Secondary | ICD-10-CM | POA: Insufficient documentation

## 2017-12-11 DIAGNOSIS — M1732 Unilateral post-traumatic osteoarthritis, left knee: Secondary | ICD-10-CM | POA: Diagnosis not present

## 2017-12-11 DIAGNOSIS — E669 Obesity, unspecified: Secondary | ICD-10-CM | POA: Diagnosis not present

## 2017-12-11 DIAGNOSIS — Z9889 Other specified postprocedural states: Secondary | ICD-10-CM | POA: Diagnosis not present

## 2017-12-31 DIAGNOSIS — E669 Obesity, unspecified: Secondary | ICD-10-CM | POA: Diagnosis not present

## 2017-12-31 DIAGNOSIS — M1730 Unilateral post-traumatic osteoarthritis, unspecified knee: Secondary | ICD-10-CM | POA: Diagnosis not present

## 2017-12-31 DIAGNOSIS — M1732 Unilateral post-traumatic osteoarthritis, left knee: Secondary | ICD-10-CM | POA: Diagnosis not present

## 2018-03-06 DIAGNOSIS — M1712 Unilateral primary osteoarthritis, left knee: Secondary | ICD-10-CM | POA: Diagnosis not present

## 2018-03-06 DIAGNOSIS — E669 Obesity, unspecified: Secondary | ICD-10-CM | POA: Diagnosis not present

## 2018-03-25 ENCOUNTER — Encounter: Payer: Self-pay | Admitting: Family Medicine

## 2018-03-25 ENCOUNTER — Ambulatory Visit (INDEPENDENT_AMBULATORY_CARE_PROVIDER_SITE_OTHER): Payer: BLUE CROSS/BLUE SHIELD | Admitting: Family Medicine

## 2018-03-25 VITALS — BP 122/80 | HR 76 | Ht 65.0 in | Wt 207.0 lb

## 2018-03-25 DIAGNOSIS — N760 Acute vaginitis: Secondary | ICD-10-CM | POA: Diagnosis not present

## 2018-03-25 DIAGNOSIS — N898 Other specified noninflammatory disorders of vagina: Secondary | ICD-10-CM | POA: Diagnosis not present

## 2018-03-25 DIAGNOSIS — Z114 Encounter for screening for human immunodeficiency virus [HIV]: Secondary | ICD-10-CM

## 2018-03-25 DIAGNOSIS — N6459 Other signs and symptoms in breast: Secondary | ICD-10-CM

## 2018-03-25 DIAGNOSIS — Z118 Encounter for screening for other infectious and parasitic diseases: Secondary | ICD-10-CM | POA: Diagnosis not present

## 2018-03-25 DIAGNOSIS — B9689 Other specified bacterial agents as the cause of diseases classified elsewhere: Secondary | ICD-10-CM | POA: Diagnosis not present

## 2018-03-25 DIAGNOSIS — Z0001 Encounter for general adult medical examination with abnormal findings: Secondary | ICD-10-CM | POA: Diagnosis not present

## 2018-03-25 DIAGNOSIS — Z0000 Encounter for general adult medical examination without abnormal findings: Secondary | ICD-10-CM

## 2018-03-25 LAB — POCT WET PREP WITH KOH
Clue Cells Wet Prep HPF POC: POSITIVE
KOH Prep POC: NEGATIVE
Trichomonas, UA: NEGATIVE

## 2018-03-25 MED ORDER — METRONIDAZOLE 500 MG PO TABS: 500.0000 mg | ORAL_TABLET | Freq: Two times a day (BID) | ORAL | 0 refills | Status: DC

## 2018-03-25 NOTE — Progress Notes (Signed)
Name: Angela French   MRN: [id number]    DOB: [birth year]   Date:03/25/2018       Progress Note  Subjective  Chief Complaint  Chief Complaint  Patient presents with  . Annual Exam    trich was in last pap- did not get med for treatment- having some discharge, mostly just an odor    Patient is a 47 year old female who presents for a comprehensive physical exam. The patient reports the following problems: vaginal diischarge with odor. Health maintenance has been reviewed and discussed lipid screening. Vaginal Discharge  The patient's primary symptoms include a genital odor and vaginal discharge. The patient's pertinent negatives include no genital itching, genital lesions, genital rash, missed menses or pelvic pain. This is a new problem. The current episode started more than 1 month ago. The problem occurs daily. The problem has been unchanged. Pertinent negatives include no abdominal pain, back pain, chills, constipation, diarrhea, dysuria, fever, flank pain, frequency, headaches, hematuria, joint pain, nausea, painful intercourse, rash, sore throat or urgency. The vaginal discharge was malodorous. The vaginal bleeding is lighter than menses. She has not been passing clots. She has not been passing tissue. She has tried nothing for the symptoms.    No problem-specific Assessment & Plan notes found for this encounter.   Past Medical History:  Diagnosis Date  . Arthritis    knee  . Wears contact lenses     Past Surgical History:  Procedure Laterality Date  . BREAST CYST EXCISION Left    neg  . FOOT SURGERY    . KNEE ARTHROSCOPY Left 04/12/2017   Procedure: ARTHROSCOPY KNEE with chonadroplasty with partial menisectomy;  Surgeon: Leanor Kail, MD;  Location: [place];  Service: Orthopedics;  Laterality: Left;    Family History  Problem Relation Age of Onset  . Diabetes Mother   . Hypertension Father   . Hypertension Sister   . Breast cancer Sister 65  . Breast  cancer Maternal Grandmother     Social History   Socioeconomic History  . Marital status: Single    Spouse name: Not on file  . Number of children: Not on file  . Years of education: Not on file  . Highest education level: Not on file  Occupational History  . Not on file  Social Needs  . Financial resource strain: Not on file  . Food insecurity:    Worry: Not on file    Inability: Not on file  . Transportation needs:    Medical: Not on file    Non-medical: Not on file  Tobacco Use  . Smoking status: Former Smoker    Types: Cigarettes    Last attempt to quit: 01/31/2010    Years since quitting: 8.1  . Smokeless tobacco: Never Used  Substance and Sexual Activity  . Alcohol use: Yes    Alcohol/week: 4.2 oz    Types: 7 Shots of liquor per week  . Drug use: No  . Sexual activity: Yes    Birth control/protection: None  Lifestyle  . Physical activity:    Days per week: Not on file    Minutes per session: Not on file  . Stress: Not on file  Relationships  . Social connections:    Talks on phone: Not on file    Gets together: Not on file    Attends religious service: Not on file    Active member of club or organization: Not on file    Attends meetings of  clubs or organizations: Not on file    Relationship status: Not on file  . Intimate partner violence:    Fear of current or ex partner: Not on file    Emotionally abused: Not on file    Physically abused: Not on file    Forced sexual activity: Not on file  Other Topics Concern  . Not on file  Social History Narrative  . Not on file    No Known Allergies  Outpatient Medications Prior to Visit  Medication Sig Dispense Refill  . acetaminophen (TYLENOL) 500 MG tablet Take 1,000 mg by mouth daily.    Marland Kitchen etodolac (LODINE) 500 MG tablet Take 1 tablet (500 mg total) by mouth 2 (two) times daily. 60 tablet 1  . traMADol (ULTRAM) 50 MG tablet Take by mouth. Dr Jefm Bryant    . NORCO 5-325 MG tablet Take 1-2 tablets by mouth  every 4 (four) hours as needed for moderate pain. MAXIMUM TOTAL ACETAMINOPHEN DOSE IS 4000 MG PER DAY 20 tablet 0   No facility-administered medications prior to visit.     Review of Systems  Constitutional: Negative for chills, fever, malaise/fatigue and weight loss.  HENT: Negative for ear discharge, ear pain and sore throat.   Eyes: Negative for blurred vision.  Respiratory: Negative for cough, sputum production, shortness of breath and wheezing.   Cardiovascular: Negative for chest pain, palpitations and leg swelling.  Gastrointestinal: Negative for abdominal pain, blood in stool, constipation, diarrhea, heartburn, melena and nausea.  Genitourinary: Positive for vaginal discharge. Negative for dysuria, flank pain, frequency, hematuria, missed menses, pelvic pain and urgency.  Musculoskeletal: Negative for back pain, joint pain, myalgias and neck pain.  Skin: Negative for rash.  Neurological: Negative for dizziness, tingling, sensory change, focal weakness and headaches.  Endo/Heme/Allergies: Negative for environmental allergies and polydipsia. Does not bruise/bleed easily.  Psychiatric/Behavioral: Negative for depression and suicidal ideas. The patient is not nervous/anxious and does not have insomnia.      Objective  Vitals:   03/25/18 0936  BP: 122/80  Pulse: 76  Weight: 207 lb (93.9 kg)  Height: 5\' 5"  (1.651 m)    Physical Exam  Constitutional: Vital signs are normal. No distress.  HENT:  Head: Normocephalic and atraumatic.  Right Ear: Hearing, tympanic membrane, external ear and ear canal normal.  Left Ear: Hearing, tympanic membrane, external ear and ear canal normal.  Nose: Nose normal. No mucosal edema.  Mouth/Throat: Uvula is midline and oropharynx is clear and moist. No oropharyngeal exudate, posterior oropharyngeal edema or posterior oropharyngeal erythema.  Eyes: Pupils are equal, round, and reactive to light. Conjunctivae, EOM and lids are normal. Right eye  exhibits no discharge. Left eye exhibits no discharge.  Fundoscopic exam:      The right eye shows no arteriolar narrowing and no AV nicking.       The left eye shows no arteriolar narrowing and no AV nicking.  Neck: Trachea normal and normal range of motion. Neck supple. Normal carotid pulses, no hepatojugular reflux and no JVD present. Carotid bruit is not present. No thyroid mass and no thyromegaly present.  Cardiovascular: Normal rate, regular rhythm, S1 normal, S2 normal, normal heart sounds and intact distal pulses. Exam reveals no gallop, no S3, no S4, no distant heart sounds and no friction rub.  No murmur heard.  No diastolic murmur is present. Pulses:      Carotid pulses are 2+ on the right side, and 2+ on the left side.  Radial pulses are 2+ on the right side, and 2+ on the left side.       Femoral pulses are 2+ on the right side, and 2+ on the left side.      Popliteal pulses are 2+ on the right side, and 2+ on the left side.       Dorsalis pedis pulses are 2+ on the right side, and 2+ on the left side.       Posterior tibial pulses are 2+ on the right side, and 2+ on the left side.  Pulmonary/Chest: Effort normal and breath sounds normal. No stridor. She has no decreased breath sounds. She has no wheezes. She has no rales. Right breast exhibits no inverted nipple, no mass, no nipple discharge, no skin change and no tenderness. Left breast exhibits skin change. Left breast exhibits no inverted nipple, no mass, no nipple discharge and no tenderness. No breast swelling, tenderness, discharge or bleeding. Breasts are asymmetrical.    Abdominal: Soft. Normal appearance and bowel sounds are normal. She exhibits no mass. There is no tenderness. There is no rigidity, no rebound, no guarding and no CVA tenderness.  Genitourinary: Rectum normal and vagina normal. Rectal exam shows guaiac negative stool. No breast swelling, tenderness, discharge or bleeding. Pelvic exam was performed with  patient supine. There is no lesion on the right labia. There is no lesion on the left labia.  Musculoskeletal: Normal range of motion. She exhibits no edema.       Lumbar back: Normal.  Lymphadenopathy:       Head (right side): No submandibular adenopathy present.       Head (left side): No submandibular adenopathy present.    She has no cervical adenopathy.    She has no axillary adenopathy.  Neurological: She is alert. She has normal strength and normal reflexes. No cranial nerve deficit or sensory deficit.  Reflex Scores:      Tricep reflexes are 2+ on the right side and 2+ on the left side.      Bicep reflexes are 2+ on the right side and 2+ on the left side.      Brachioradialis reflexes are 2+ on the right side and 2+ on the left side.      Patellar reflexes are 2+ on the right side and 2+ on the left side.      Achilles reflexes are 2+ on the right side and 2+ on the left side. Skin: Skin is warm, dry and intact. She is not diaphoretic.  Scar on left breast  Nursing note and vitals reviewed.     Assessment & Plan  Problem List Items Addressed This Visit    None    Visit Diagnoses    Annual physical exam    -  Primary   Subjective/objective concerns noted and addressed.   Bacterial vaginosis       Clue cells noted. will treat with metronidazole 500mg  bid for 7 days.   Relevant Medications   metroNIDAZOLE (FLAGYL) 500 MG tablet   Other Relevant Orders   POCT Wet Prep with KOH (Completed)   Encounter for screening for HIV       Patient meets criteria for HIV screening.    Relevant Orders   HIV antibody   Encounter for screening examination for chlamydial infection       Relevant Orders   GC/Chlamydia Probe Amp([facility])   Vaginal discharge       Patient states that she had a malodorous dishrge. Wet  prep c/w bacterial vaginosis.   Relevant Orders   GC/Chlamydia Probe Amp([facility])   Abnormal breast exam       Patient states she had a "boil' that ruptured resulting to  changes to superior aspect of left areolar.will do diagnostic mammogram, then determine next step.   Relevant Orders   MM Digital Screening      Meds ordered this encounter  Medications  . metroNIDAZOLE (FLAGYL) 500 MG tablet    Sig: Take 1 tablet (500 mg total) by mouth 2 (two) times daily.    Dispense:  14 tablet    Refill:  0  Shanquita T Chretien is a 47 y.o. female who presents today for her Complete Annual Exam. She feels well. She reports exercising some. She reports she is sleeping well.  Immunizations are reviewed and recommendations provided.   Age appropriate screening tests are discussed. Counseling given for risk factor reduction interventions. Health risks of being over weight were discussed and patient was counseled on weight loss options and exercise.  Dr. [facility]  03/25/18

## 2018-03-26 LAB — GC/CHLAMYDIA PROBE AMP
Chlamydia trachomatis, NAA: NEGATIVE
Neisseria gonorrhoeae by PCR: NEGATIVE

## 2018-03-26 LAB — HIV ANTIBODY (ROUTINE TESTING W REFLEX): HIV Screen 4th Generation wRfx: NONREACTIVE

## 2018-03-26 NOTE — Addendum Note (Signed)
Addended by: Berton Mount L on: 03/26/2018 11:20 AM   Modules accepted: Orders

## 2018-03-28 ENCOUNTER — Other Ambulatory Visit: Payer: BLUE CROSS/BLUE SHIELD

## 2018-04-08 ENCOUNTER — Other Ambulatory Visit: Payer: Self-pay | Admitting: Family Medicine

## 2018-04-08 ENCOUNTER — Ambulatory Visit
Admission: RE | Admit: 2018-04-08 | Discharge: 2018-04-08 | Disposition: A | Discharge status: Home / Self Care | Payer: BLUE CROSS/BLUE SHIELD | Source: Ambulatory Visit | Attending: Family Medicine | Admitting: Family Medicine

## 2018-04-08 DIAGNOSIS — R928 Other abnormal and inconclusive findings on diagnostic imaging of breast: Secondary | ICD-10-CM | POA: Diagnosis not present

## 2018-04-08 DIAGNOSIS — N6320 Unspecified lump in the left breast, unspecified quadrant: Secondary | ICD-10-CM | POA: Diagnosis not present

## 2018-04-08 DIAGNOSIS — N6459 Other signs and symptoms in breast: Secondary | ICD-10-CM

## 2018-04-16 DIAGNOSIS — Z9889 Other specified postprocedural states: Secondary | ICD-10-CM | POA: Diagnosis not present

## 2018-04-16 DIAGNOSIS — E669 Obesity, unspecified: Secondary | ICD-10-CM | POA: Diagnosis not present

## 2018-04-16 DIAGNOSIS — M1732 Unilateral post-traumatic osteoarthritis, left knee: Secondary | ICD-10-CM | POA: Diagnosis not present

## 2018-04-16 DIAGNOSIS — M1712 Unilateral primary osteoarthritis, left knee: Secondary | ICD-10-CM | POA: Diagnosis not present

## 2018-04-22 ENCOUNTER — Ambulatory Visit
Admission: RE | Admit: 2018-04-22 | Discharge: 2018-04-22 | Disposition: A | Discharge status: Home / Self Care | Payer: BLUE CROSS/BLUE SHIELD | Source: Ambulatory Visit | Attending: Family Medicine | Admitting: Family Medicine

## 2018-04-22 ENCOUNTER — Other Ambulatory Visit: Payer: Self-pay | Admitting: Family Medicine

## 2018-04-22 DIAGNOSIS — N6342 Unspecified lump in left breast, subareolar: Secondary | ICD-10-CM | POA: Diagnosis not present

## 2018-04-22 DIAGNOSIS — N6320 Unspecified lump in the left breast, unspecified quadrant: Secondary | ICD-10-CM | POA: Diagnosis not present

## 2018-04-22 DIAGNOSIS — N6002 Solitary cyst of left breast: Secondary | ICD-10-CM | POA: Diagnosis not present

## 2018-04-22 DIAGNOSIS — R928 Other abnormal and inconclusive findings on diagnostic imaging of breast: Secondary | ICD-10-CM

## 2018-04-22 DIAGNOSIS — N61 Mastitis without abscess: Secondary | ICD-10-CM | POA: Diagnosis not present

## 2018-04-22 DIAGNOSIS — R590 Localized enlarged lymph nodes: Secondary | ICD-10-CM | POA: Diagnosis not present

## 2018-04-22 HISTORY — PX: BREAST BIOPSY: SHX20

## 2018-04-23 LAB — SURGICAL PATHOLOGY

## 2018-04-24 ENCOUNTER — Telehealth: Payer: Self-pay

## 2018-04-24 ENCOUNTER — Other Ambulatory Visit: Payer: Self-pay

## 2018-04-24 DIAGNOSIS — N61 Mastitis without abscess: Secondary | ICD-10-CM

## 2018-04-24 MED ORDER — DOXYCYCLINE HYCLATE 100 MG PO TABS: 100.0000 mg | ORAL_TABLET | Freq: Two times a day (BID) | ORAL | 0 refills | Status: DC

## 2018-04-24 NOTE — Telephone Encounter (Signed)
JoAnne from radiology called to say the biopsy of breast was negative/ benign. However Dr. Mariea Clonts could see inflammation and thinks the pt would benefit from an antibiotic. Dr. Ronnald Ramp has prescribed Doxy BID x 10 days- sent to [facility]. Asked pt to schedule a follow up appt in 10 days, after completion of antibiotic. Offered to transfer to schedule, but pt said she will call back due to not knowing what her schedule is.

## 2018-05-20 ENCOUNTER — Ambulatory Visit (INDEPENDENT_AMBULATORY_CARE_PROVIDER_SITE_OTHER): Payer: BLUE CROSS/BLUE SHIELD | Admitting: Family Medicine

## 2018-05-20 ENCOUNTER — Encounter: Payer: Self-pay | Admitting: Family Medicine

## 2018-05-20 VITALS — BP 130/80 | HR 84 | Ht 65.0 in | Wt 202.0 lb

## 2018-05-20 DIAGNOSIS — N611 Abscess of the breast and nipple: Secondary | ICD-10-CM

## 2018-05-20 MED ORDER — DOXYCYCLINE HYCLATE 50 MG PO CAPS: 50.0000 mg | ORAL_CAPSULE | Freq: Every day | ORAL | 2 refills | Status: DC

## 2018-05-20 NOTE — Progress Notes (Signed)
Name: Angela French   MRN: [id number]    DOB: 05/02/1971   Date:05/20/2018       Progress Note  Subjective  Chief Complaint  Chief Complaint  Patient presents with  . Follow-up    recommended doxy for antibiotic therapy by radiologist. Has completed antibiotics/ wanted to see after completion    Patient with breast biopsy suggest chronic infection of left areolar area. Treated with doxycycline bid for 10 days recheck today. Decreased tenderness and swelling noted with therapy.  Rash  This is a chronic problem. The current episode started more than 1 year ago. The problem has been waxing and waning since onset. Location: left breast. She was exposed to nothing. Pertinent negatives include no cough, diarrhea, fever, joint pain, shortness of breath or sore throat. (No drainage nor fever.) Past treatments include antibiotics (Doxycline).    No problem-specific Assessment & Plan notes found for this encounter.   Past Medical History:  Diagnosis Date  . Arthritis    knee  . Wears contact lenses     Past Surgical History:  Procedure Laterality Date  . BREAST BIOPSY Left 04/22/2018   US Aspiration, US biopsy and Lymph node biopsy  . BREAST CYST EXCISION Left    neg  . FOOT SURGERY    . KNEE ARTHROSCOPY Left 04/12/2017   Procedure: ARTHROSCOPY KNEE with chonadroplasty with partial menisectomy;  Surgeon: Leanor Kail, MD;  Location: [facility];  Service: Orthopedics;  Laterality: Left;    Family History  Problem Relation Age of Onset  . Diabetes Mother   . Hypertension Father   . Hypertension Sister   . Breast cancer Sister 31  . Breast cancer Maternal Grandmother     Social History   Socioeconomic History  . Marital status: Single    Spouse name: Not on file  . Number of children: Not on file  . Years of education: Not on file  . Highest education level: Not on file  Occupational History  . Not on file  Social Needs  . Financial resource strain: Not on  file  . Food insecurity:    Worry: Not on file    Inability: Not on file  . Transportation needs:    Medical: Not on file    Non-medical: Not on file  Tobacco Use  . Smoking status: Former Smoker    Types: Cigarettes    Last attempt to quit: 01/31/2010    Years since quitting: 8.3  . Smokeless tobacco: Never Used  Substance and Sexual Activity  . Alcohol use: Yes    Alcohol/week: 7.0 standard drinks    Types: 7 Shots of liquor per week  . Drug use: No  . Sexual activity: Yes    Birth control/protection: None  Lifestyle  . Physical activity:    Days per week: Not on file    Minutes per session: Not on file  . Stress: Not on file  Relationships  . Social connections:    Talks on phone: Not on file    Gets together: Not on file    Attends religious service: Not on file    Active member of club or organization: Not on file    Attends meetings of clubs or organizations: Not on file    Relationship status: Not on file  . Intimate partner violence:    Fear of current or ex partner: Not on file    Emotionally abused: Not on file    Physically abused: Not on file  Forced sexual activity: Not on file  Other Topics Concern  . Not on file  Social History Narrative  . Not on file    No Known Allergies  Outpatient Medications Prior to Visit  Medication Sig Dispense Refill  . acetaminophen (TYLENOL) 500 MG tablet Take 1,000 mg by mouth daily.    Marland Kitchen etodolac (LODINE) 500 MG tablet Take 1 tablet (500 mg total) by mouth 2 (two) times daily. 60 tablet 1  . traMADol (ULTRAM) 50 MG tablet Take by mouth. Dr Jefm Bryant    . metroNIDAZOLE (FLAGYL) 500 MG tablet Take 1 tablet (500 mg total) by mouth 2 (two) times daily. 14 tablet 0  . doxycycline (VIBRA-TABS) 100 MG tablet Take 1 tablet (100 mg total) by mouth 2 (two) times daily. (Patient not taking: Reported on 05/20/2018) 20 tablet 0   No facility-administered medications prior to visit.     Review of Systems  Constitutional: Negative  for chills, fever, malaise/fatigue and weight loss.  HENT: Negative for ear discharge, ear pain and sore throat.   Eyes: Negative for blurred vision.  Respiratory: Negative for cough, sputum production, shortness of breath and wheezing.   Cardiovascular: Negative for chest pain, palpitations and leg swelling.  Gastrointestinal: Negative for abdominal pain, blood in stool, constipation, diarrhea, heartburn, melena and nausea.  Genitourinary: Negative for dysuria, frequency, hematuria and urgency.  Musculoskeletal: Negative for back pain, joint pain, myalgias and neck pain.  Skin: Positive for rash.  Neurological: Negative for dizziness, tingling, sensory change, focal weakness and headaches.  Endo/Heme/Allergies: Negative for environmental allergies and polydipsia. Does not bruise/bleed easily.  Psychiatric/Behavioral: Negative for depression and suicidal ideas. The patient is not nervous/anxious and does not have insomnia.      Objective  Vitals:   05/20/18 1057  BP: 130/80  Pulse: 84  Weight: 202 lb (91.6 kg)  Height: 5\' 5"  (1.651 m)    Physical Exam  Constitutional: She is oriented to person, place, and time. She appears well-developed and well-nourished.  HENT:  Head: Normocephalic.  Right Ear: External ear normal.  Left Ear: External ear normal.  Mouth/Throat: Oropharynx is clear and moist.  Eyes: Pupils are equal, round, and reactive to light. Conjunctivae and EOM are normal. Lids are everted and swept, no foreign bodies found. Left eye exhibits no hordeolum. No foreign body present in the left eye. Right conjunctiva is not injected. Left conjunctiva is not injected. No scleral icterus.  Neck: Normal range of motion. Neck supple. No JVD present. No tracheal deviation present. No thyromegaly present.  Cardiovascular: Normal rate, regular rhythm, normal heart sounds and intact distal pulses. Exam reveals no gallop and no friction rub.  No murmur heard. Pulmonary/Chest: Effort  normal and breath sounds normal. No respiratory distress. She has no wheezes. She has no rales.  Abdominal: Soft. Bowel sounds are normal. She exhibits no mass. There is no hepatosplenomegaly. There is no tenderness. There is no rebound and no guarding.  Musculoskeletal: Normal range of motion. She exhibits no edema or tenderness.  Lymphadenopathy:    She has no cervical adenopathy.  Neurological: She is alert and oriented to person, place, and time. She has normal strength. She displays normal reflexes. No cranial nerve deficit.  Skin: Skin is warm. No rash noted. No erythema.  No swelling/erythema, nor tenderness.  Psychiatric: She has a normal mood and affect. Her mood appears not anxious. She does not exhibit a depressed mood.  Nursing note and vitals reviewed.     Assessment & Plan  Problem List Items Addressed This Visit    None    Visit Diagnoses    Furuncle of breast    -  Primary   Biopsy c/w with localized infection. Improved on doxy 100 mg bid/will suppress with 50 mg daily for 6 weeks   Relevant Medications   doxycycline (VIBRAMYCIN) 50 MG capsule      Meds ordered this encounter  Medications  . doxycycline (VIBRAMYCIN) 50 MG capsule    Sig: Take 1 capsule (50 mg total) by mouth daily.    Dispense:  30 capsule    Refill:  2      Dr. [facility]  05/20/18

## 2018-08-13 ENCOUNTER — Other Ambulatory Visit: Payer: Self-pay | Admitting: Family Medicine

## 2018-08-13 DIAGNOSIS — N611 Abscess of the breast and nipple: Secondary | ICD-10-CM

## 2018-10-02 DIAGNOSIS — M1712 Unilateral primary osteoarthritis, left knee: Secondary | ICD-10-CM | POA: Diagnosis not present

## 2018-12-22 ENCOUNTER — Other Ambulatory Visit: Payer: Self-pay | Admitting: Family Medicine

## 2018-12-22 DIAGNOSIS — N611 Abscess of the breast and nipple: Secondary | ICD-10-CM

## 2019-01-01 DIAGNOSIS — M25462 Effusion, left knee: Secondary | ICD-10-CM | POA: Diagnosis not present

## 2019-01-01 DIAGNOSIS — M25562 Pain in left knee: Secondary | ICD-10-CM | POA: Diagnosis not present

## 2019-01-01 DIAGNOSIS — M1712 Unilateral primary osteoarthritis, left knee: Secondary | ICD-10-CM | POA: Diagnosis not present

## 2019-04-17 DIAGNOSIS — M1712 Unilateral primary osteoarthritis, left knee: Secondary | ICD-10-CM | POA: Diagnosis not present

## 2019-04-17 DIAGNOSIS — E669 Obesity, unspecified: Secondary | ICD-10-CM | POA: Diagnosis not present

## 2019-05-17 IMAGING — MR MR KNEE*L* W/O CM
6 series · 33 of 40 positions shown · non-contrast
Comparison: None.

CLINICAL DATA: Left knee pain. Limited range of motion. Pain for 1
year.

EXAM:
MRI OF THE LEFT KNEE WITHOUT CONTRAST
TECHNIQUE: Multiplanar, multisequence MR imaging of the knee was performed. No
intravenous contrast was administered.

[Series 3: PD fat-sat · axial · 3.0mm · 0.50mm/px · z∈[-64,+61]mm · 6 of 39 slices shown (1 of 4)]
[im 1/39]
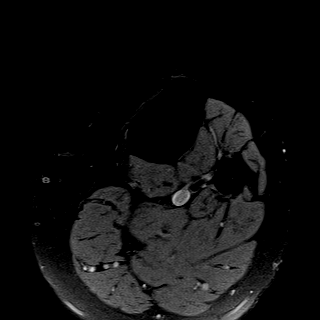
[im 8/39]
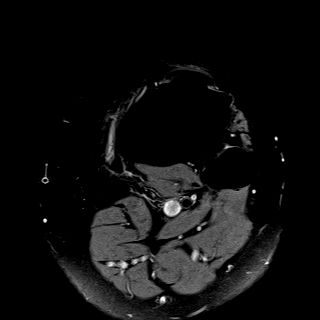
[im 16/39]
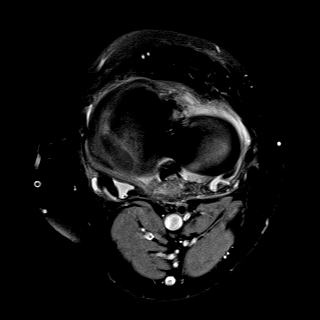
[im 23/39]
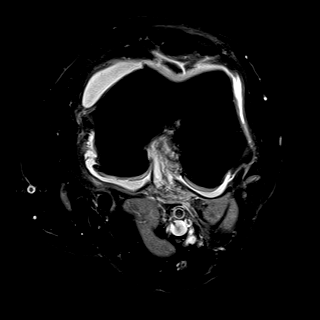
[im 31/39]
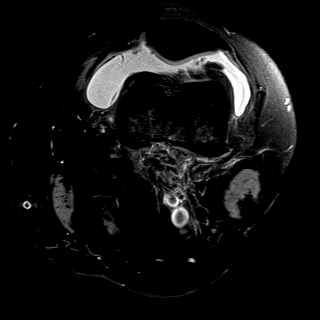
[im 39/39]
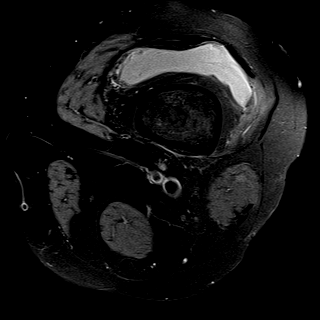

[Series 4: T1 · coronal · 3.0mm · 0.50mm/px · 1 of 44 slices shown]
[im 1/44]
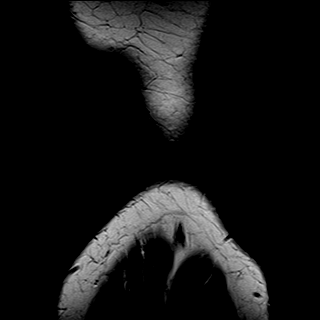

[Series 5: T2 fat-sat · coronal · 3.0mm · 0.31mm/px · 8 of 44 slices shown]
[im 1/44]
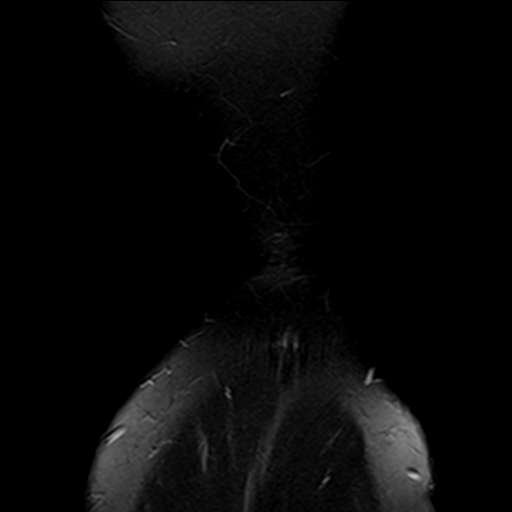
[im 7/44]
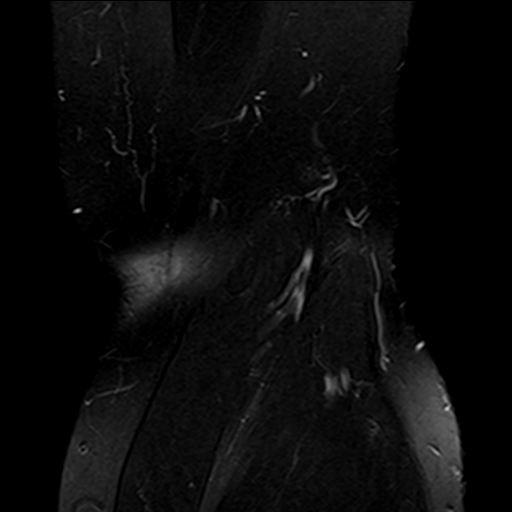
[im 13/44]
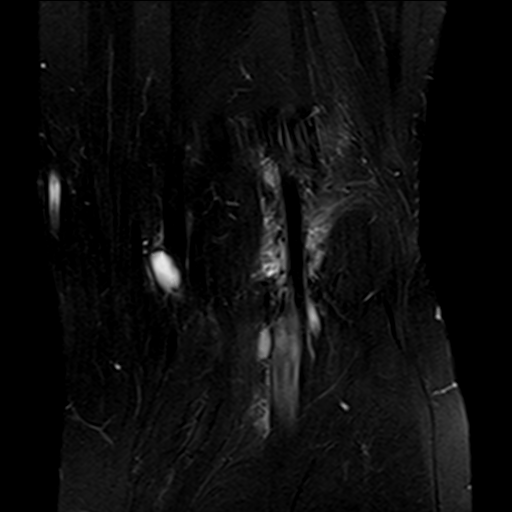
[im 19/44]
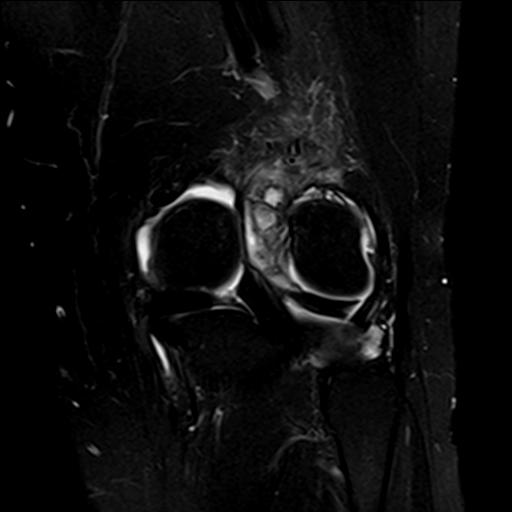
[im 25/44]
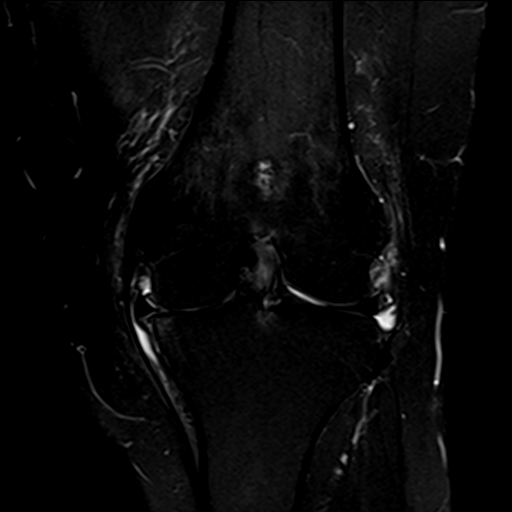
[im 31/44]
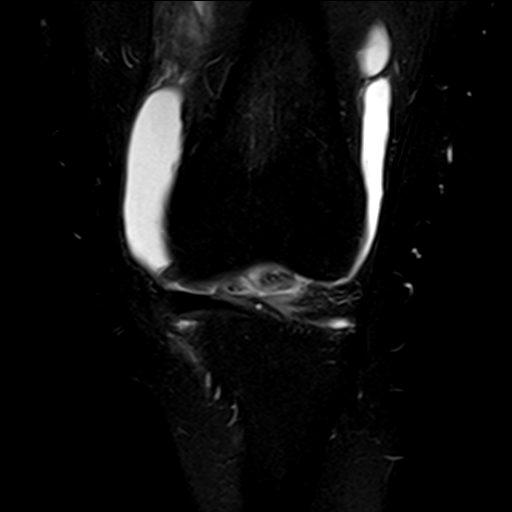
[im 37/44]
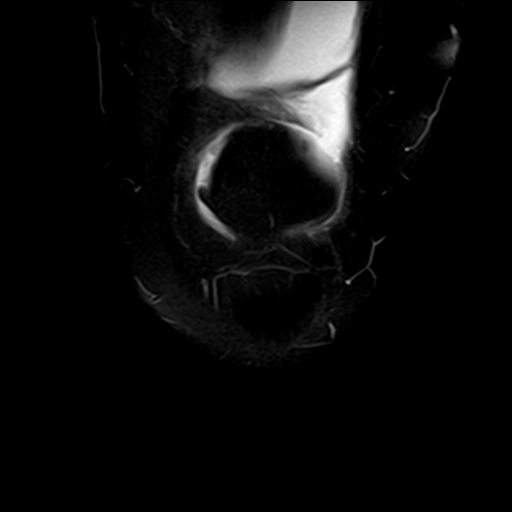
[im 44/44]
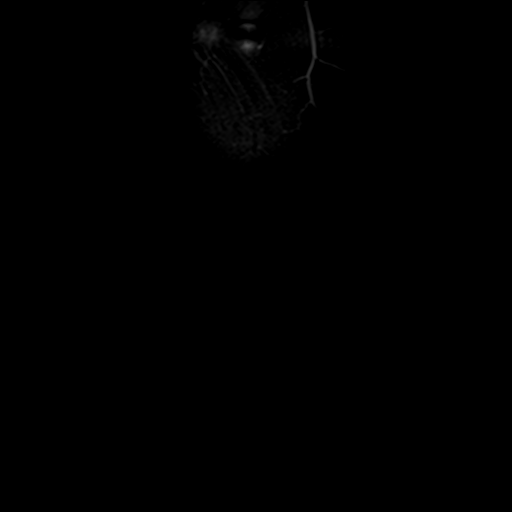

[Series 6: PD fat-sat · sagittal · 3.0mm · 0.50mm/px · 6 of 36 slices shown (2 of 4)]
[im 1/36]
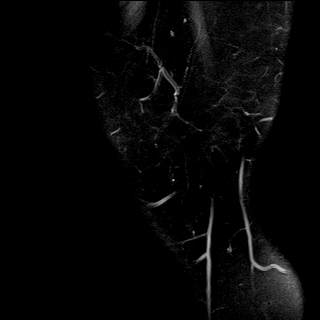
[im 8/36]
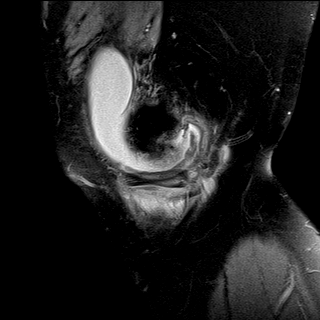
[im 15/36]
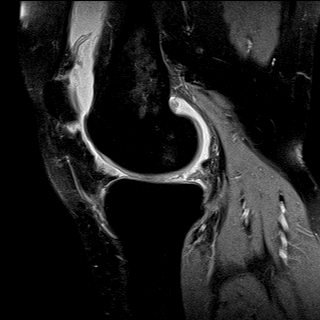
[im 22/36]
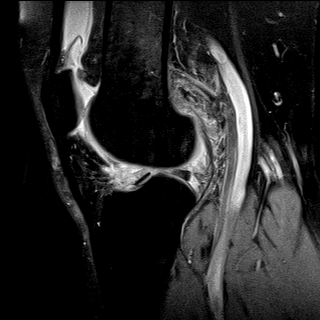
[im 29/36]
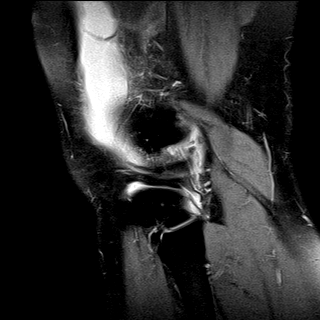
[im 36/36]
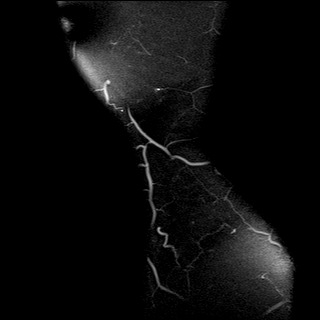

[Series 7: PD fat-sat · coronal · 3.0mm · 0.50mm/px · 8 of 44 slices shown (3 of 4)]
[im 1/44]
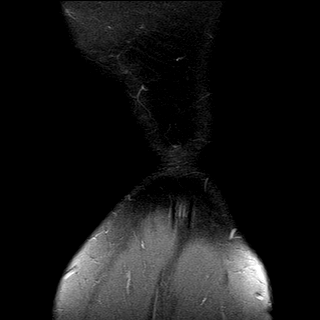
[im 7/44]
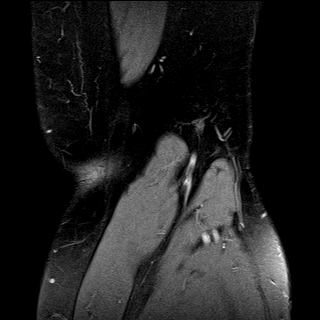
[im 13/44]
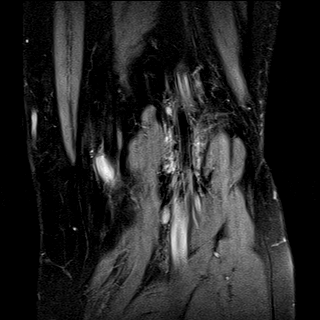
[im 19/44]
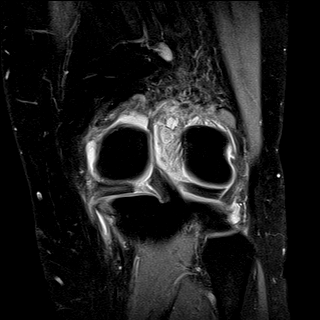
[im 25/44]
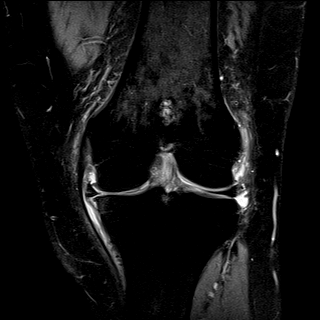
[im 31/44]
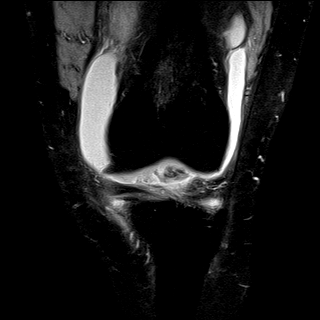
[im 37/44]
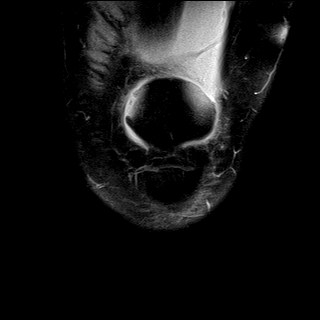
[im 44/44]
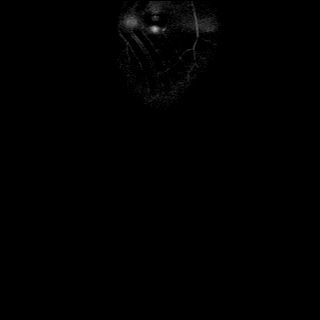

[Series 8: PD fat-sat · oblique · 2.0mm · 0.62mm/px · 4 of 26 slices shown (4 of 4)]
[im 1/26]
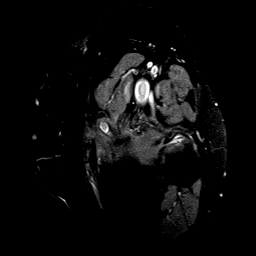
[im 9/26]
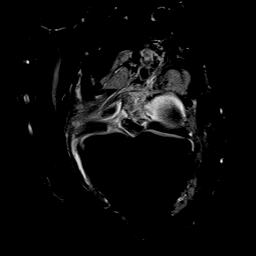
[im 17/26]
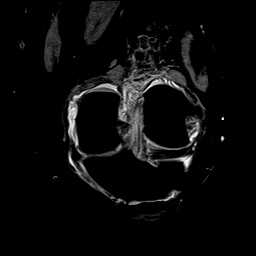
[im 26/26]
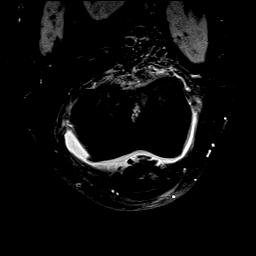

[33 of 40 positions shown; findings below may reference images not displayed]

FINDINGS: MENISCI

Medial meniscus: Radial tear of the posterior horn of the medial
meniscus adjacent to the meniscal root with peripheral meniscal
extrusion. Oblique tear of the body of the medial meniscus extending
to the inferior articular surface. Increased signal within the body
and posterior horn of the medial meniscus consistent with
degeneration.

Lateral meniscus:  Intact.

LIGAMENTS

Cruciates: Intact ACL. ACL is severely expanded and increased in
signal consistent with severe mucinous degeneration. Intact PCL.

Collaterals: Medial collateral ligament is intact. Lateral
collateral ligament complex is intact.

CARTILAGE

Patellofemoral: Partial-thickness cartilage loss of the medial
patellofemoral compartment.

Medial: Extensive full-thickness cartilage loss of the medial
femorotibial compartment with mild subchondral reactive marrow edema
in the periphery of the medial tibial plateau.

Lateral:  Mild chondral thinning.

Joint: Large joint effusion. Normal Hoffa's fat. No plical
thickening.

Popliteal Fossa: No Baker cyst. Intact popliteus tendon. Small
amount of fluid surrounding the semimembranosus tendon most
concerning for tenosynovitis.

Extensor Mechanism: Intact quadriceps tendon and patellar tendon.
Intact medial and lateral patellar retinaculum. Intact MPFL.

Bones: 19 x 13 mm T2 hyperintense intramedullary bone lesion in the
distal femoral metaphysis with stippled low signal throughout the
bone lesion most consistent with a benign chondroid lesion such as
an enchondroma. No surrounding marrow edema, bone destruction,
periosteal reaction or soft tissue mass.

No other marrow signal abnormality.  No fracture or dislocation.

Other: No fluid collection or hematoma.
IMPRESSION: 1. Radial tear of the posterior horn of the medial meniscus adjacent
to the meniscal root with peripheral meniscal extrusion. Oblique
tear of the body of the medial meniscus extending to the inferior
articular surface. Increased signal within the body and posterior
horn of the medial meniscus consistent with degeneration.
2. Intact ACL with severe mucinous degeneration.
3. Extensive full-thickness cartilage loss of the medial
femorotibial compartment with mild subchondral reactive marrow edema
in the periphery of the medial tibial plateau.
4. Partial-thickness cartilage loss of the medial patellofemoral
compartment.
5. Large joint effusion.

## 2019-06-03 ENCOUNTER — Other Ambulatory Visit: Payer: Self-pay | Admitting: Family Medicine

## 2019-06-03 ENCOUNTER — Encounter: Payer: BC Managed Care – PPO | Admitting: Family Medicine

## 2019-06-03 DIAGNOSIS — Z1231 Encounter for screening mammogram for malignant neoplasm of breast: Secondary | ICD-10-CM

## 2019-06-12 ENCOUNTER — Encounter: Payer: BC Managed Care – PPO | Admitting: Family Medicine

## 2019-06-24 ENCOUNTER — Ambulatory Visit (INDEPENDENT_AMBULATORY_CARE_PROVIDER_SITE_OTHER): Payer: BC Managed Care – PPO | Admitting: Family Medicine

## 2019-06-24 ENCOUNTER — Other Ambulatory Visit: Payer: Self-pay

## 2019-06-24 ENCOUNTER — Encounter: Payer: Self-pay | Admitting: Family Medicine

## 2019-06-24 VITALS — BP 152/96 | HR 88 | Resp 16 | Ht 65.0 in | Wt 194.0 lb

## 2019-06-24 DIAGNOSIS — Z0000 Encounter for general adult medical examination without abnormal findings: Secondary | ICD-10-CM | POA: Diagnosis not present

## 2019-06-24 DIAGNOSIS — Z23 Encounter for immunization: Secondary | ICD-10-CM | POA: Diagnosis not present

## 2019-06-24 DIAGNOSIS — R030 Elevated blood-pressure reading, without diagnosis of hypertension: Secondary | ICD-10-CM | POA: Diagnosis not present

## 2019-06-24 NOTE — Progress Notes (Signed)
Date:  06/24/2019   Name:  Angela French   DOB:  03/06/1971   MRN:  [id number]   Chief Complaint: Annual Exam  Patient is a 48 year old female who presents for a comprehensive physical exam. The patient reports the following problems: none. Health maintenance has been reviewed up to date.   Review of Systems  Constitutional: Negative for chills and fever.  HENT: Negative for drooling, ear discharge, ear pain and sore throat.   Respiratory: Negative for cough, shortness of breath and wheezing.   Cardiovascular: Negative for chest pain, palpitations and leg swelling.  Gastrointestinal: Negative for abdominal pain, blood in stool, constipation, diarrhea and nausea.  Endocrine: Negative for polydipsia.  Genitourinary: Negative for dysuria, frequency, hematuria and urgency.  Musculoskeletal: Negative for back pain, myalgias and neck pain.  Skin: Negative for rash.  Allergic/Immunologic: Negative for environmental allergies.  Neurological: Negative for dizziness and headaches.  Hematological: Does not bruise/bleed easily.  Psychiatric/Behavioral: Negative for suicidal ideas. The patient is not nervous/anxious.     Patient Active Problem List   Diagnosis Date Noted  . Obesity 07/26/2017  . Status post arthroscopy of left knee 07/26/2017  . Primary osteoarthritis of left knee 03/29/2017    No Known Allergies  Past Surgical History:  Procedure Laterality Date  . BREAST BIOPSY Left 04/22/2018   US Aspiration, US biopsy and Lymph node biopsy  . BREAST CYST EXCISION Left    neg  . FOOT SURGERY    . KNEE ARTHROSCOPY Left 04/12/2017   Procedure: ARTHROSCOPY KNEE with chonadroplasty with partial menisectomy;  Surgeon: Leanor Kail, MD;  Location: [facility];  Service: Orthopedics;  Laterality: Left;    Social History   Tobacco Use  . Smoking status: Former Smoker    Types: Cigarettes    Quit date: 01/31/2010    Years since quitting: 9.4  . Smokeless tobacco:  Never Used  Substance Use Topics  . Alcohol use: Yes    Alcohol/week: 7.0 standard drinks    Types: 7 Shots of liquor per week  . Drug use: No     Medication list has been reviewed and updated.  Current Meds  Medication Sig  . acetaminophen (TYLENOL) 500 MG tablet Take 1,000 mg by mouth daily.  Marland Kitchen doxycycline (VIBRAMYCIN) 50 MG capsule Take 1 capsule (50 mg total) by mouth daily.  Marland Kitchen etodolac (LODINE) 500 MG tablet Take 1 tablet (500 mg total) by mouth 2 (two) times daily.  . traMADol (ULTRAM) 50 MG tablet Take by mouth. Dr Jefm Bryant    [facility] 2/9 Scores 06/24/2019 05/20/2018 03/25/2018  PHQ - 2 Score 0 0 2  PHQ- 9 Score - 1 4    BP Readings from Last 3 Encounters:  06/24/19 (!) 152/96  05/20/18 130/80  03/25/18 122/80    Physical Exam Vitals signs and nursing note reviewed.  Constitutional:      Appearance: She is well-developed and overweight.  HENT:     Head: Normocephalic.     Jaw: There is normal jaw occlusion.     Salivary Glands: Right salivary gland is not diffusely enlarged. Left salivary gland is not diffusely enlarged.     Right Ear: Hearing, tympanic membrane, ear canal and external ear normal.     Left Ear: Hearing, tympanic membrane, ear canal and external ear normal.     Nose: Nose normal.     Mouth/Throat:     Lips: Pink.     Mouth: Mucous membranes are moist.  Dentition: Normal dentition.     Pharynx: Oropharynx is clear. Uvula midline.  Eyes:     General: Lids are normal. Vision grossly intact. Gaze aligned appropriately. No visual field deficit or scleral icterus.       Left eye: No foreign body or hordeolum.     Extraocular Movements: Extraocular movements intact.     Conjunctiva/sclera: Conjunctivae normal.     Right eye: Right conjunctiva is not injected.     Left eye: Left conjunctiva is not injected.     Pupils: Pupils are equal, round, and reactive to light.     Funduscopic exam:    Right eye: Red reflex present.        Left eye: Red reflex  present. Neck:     Musculoskeletal: Normal range of motion and neck supple.     Thyroid: No thyroid mass, thyromegaly or thyroid tenderness.     Vascular: Normal carotid pulses. No carotid bruit, hepatojugular reflux or JVD.     Trachea: Trachea normal. No tracheal deviation.  Cardiovascular:     Rate and Rhythm: Normal rate and regular rhythm.     Chest Wall: PMI is not displaced.     Pulses: Normal pulses.          Carotid pulses are 2+ on the right side and 2+ on the left side.      Radial pulses are 2+ on the right side and 2+ on the left side.       Femoral pulses are 2+ on the right side and 2+ on the left side.      Popliteal pulses are 2+ on the right side and 2+ on the left side.       Dorsalis pedis pulses are 2+ on the right side and 2+ on the left side.       Posterior tibial pulses are 2+ on the right side and 2+ on the left side.     Heart sounds: S1 normal and S2 normal. Heart sounds not distant. No murmur. No systolic murmur. No diastolic murmur. No friction rub. No gallop. No S3 or S4 sounds.   Pulmonary:     Effort: Pulmonary effort is normal. No respiratory distress.     Breath sounds: Normal breath sounds. No decreased breath sounds, wheezing, rhonchi or rales.  Chest:     Chest wall: No mass.     Breasts:        Right: Normal. No swelling, bleeding, inverted nipple, mass, nipple discharge, skin change or tenderness.        Left: Normal. No swelling, bleeding, inverted nipple, mass, nipple discharge, skin change or tenderness.  Abdominal:     General: Bowel sounds are normal.     Palpations: Abdomen is soft. There is no shifting dullness, hepatomegaly or mass.     Tenderness: There is no abdominal tenderness. There is no guarding or rebound.  Musculoskeletal: Normal range of motion.        General: No tenderness.     Cervical back: Normal.     Thoracic back: Normal.     Lumbar back: Normal.     Right lower leg: No edema.     Left lower leg: No edema.   Lymphadenopathy:     Head:     Right side of head: No submental or submandibular adenopathy.     Left side of head: No submental or submandibular adenopathy.     Cervical: No cervical adenopathy.     Right cervical: No  superficial, deep or posterior cervical adenopathy.    Left cervical: No superficial, deep or posterior cervical adenopathy.     Upper Body:     Right upper body: No supraclavicular or axillary adenopathy.     Left upper body: No supraclavicular or axillary adenopathy.  Skin:    General: Skin is warm.     Capillary Refill: Capillary refill takes less than 2 seconds.     Findings: No rash.  Neurological:     General: No focal deficit present.     Mental Status: She is alert and oriented to person, place, and time.     Cranial Nerves: Cranial nerves are intact. No cranial nerve deficit, dysarthria or facial asymmetry.     Sensory: Sensation is intact.     Motor: Motor function is intact.     Deep Tendon Reflexes: Reflexes are normal and symmetric. Reflexes normal.     Reflex Scores:      Tricep reflexes are 2+ on the right side and 2+ on the left side.      Bicep reflexes are 2+ on the right side and 2+ on the left side.      Brachioradialis reflexes are 2+ on the right side and 2+ on the left side.      Patellar reflexes are 2+ on the right side and 2+ on the left side.      Achilles reflexes are 2+ on the right side and 2+ on the left side. Psychiatric:        Mood and Affect: Mood is not anxious or depressed.     Wt Readings from Last 3 Encounters:  06/24/19 194 lb (88 kg)  05/20/18 202 lb (91.6 kg)  03/25/18 207 lb (93.9 kg)    BP (!) 152/96   Pulse 88   Resp 16   Ht 5\' 5"  (1.651 m)   Wt 194 lb (88 kg)   LMP 06/16/2019   SpO2 98%   BMI 32.28 kg/m   Assessment and Plan:  1. Need for immunization against influenza Discussed and administered. - Flu Vaccine QUAD 36+ mos IM  2. Annual physical exam No subjective/objective concerns noted during  history and physical.  Patient's previous encounters were reviewed as well as labs and imaging.Tanaysia ANASHIA RILL is a 48 y.o. female who presents today for her Complete Annual Exam. She feels well. She reports exercising playing basketball. She reports she is sleeping well. Immunizations are reviewed and recommendations provided.   Age appropriate screening tests are discussed. Counseling given for risk factor reduction interventions. - Renal Function Panel - Lipid panel  3. Elevated blood pressure reading in office with white coat syndrome, without diagnosis of hypertension Review of previous blood pressure readings have been excellent so we are going to reemphasize low-sodium diet at this time and will recheck in 4 to 6 months.

## 2019-06-25 LAB — RENAL FUNCTION PANEL
Albumin: 4.2 g/dL (ref 3.8–4.8)
BUN/Creatinine Ratio: 10 (ref 9–23)
BUN: 8 mg/dL (ref 6–24)
CO2: 22 mmol/L (ref 20–29)
Calcium: 9.2 mg/dL (ref 8.7–10.2)
Chloride: 104 mmol/L (ref 96–106)
Creatinine, Ser: 0.79 mg/dL (ref 0.57–1.00)
GFR calc Af Amer: 105 mL/min/{1.73_m2} (ref >59)
GFR calc non Af Amer: 91 mL/min/{1.73_m2} (ref >59)
Glucose: 89 mg/dL (ref 65–99)
Phosphorus: 3 mg/dL (ref 3.0–4.3)
Potassium: 4 mmol/L (ref 3.5–5.2)
Sodium: 140 mmol/L (ref 134–144)

## 2019-06-25 LAB — LIPID PANEL
Chol/HDL Ratio: 1.9 ratio (ref 0.0–4.4)
Cholesterol, Total: 129 mg/dL (ref 100–199)
HDL: 67 mg/dL (ref >39)
LDL Chol Calc (NIH): 42 mg/dL (ref 0–99)
Triglycerides: 110 mg/dL (ref 0–149)
VLDL Cholesterol Cal: 20 mg/dL (ref 5–40)

## 2019-07-09 ENCOUNTER — Ambulatory Visit
Admission: RE | Admit: 2019-07-09 | Discharge: 2019-07-09 | Disposition: A | Discharge status: Home / Self Care | Payer: BC Managed Care – PPO | Source: Ambulatory Visit | Attending: Family Medicine | Admitting: Family Medicine

## 2019-07-09 DIAGNOSIS — Z1231 Encounter for screening mammogram for malignant neoplasm of breast: Secondary | ICD-10-CM

## 2019-11-02 DIAGNOSIS — H18822 Corneal disorder due to contact lens, left eye: Secondary | ICD-10-CM | POA: Diagnosis not present

## 2020-05-12 ENCOUNTER — Ambulatory Visit (INDEPENDENT_AMBULATORY_CARE_PROVIDER_SITE_OTHER): Payer: BC Managed Care – PPO | Admitting: Family Medicine

## 2020-05-12 ENCOUNTER — Encounter: Payer: Self-pay | Admitting: Family Medicine

## 2020-05-12 ENCOUNTER — Other Ambulatory Visit: Payer: Self-pay

## 2020-05-12 ENCOUNTER — Encounter: Payer: BC Managed Care – PPO | Admitting: Family Medicine

## 2020-05-12 VITALS — BP 134/62 | HR 80 | Ht 65.0 in | Wt 202.0 lb

## 2020-05-12 DIAGNOSIS — Z86018 Personal history of other benign neoplasm: Secondary | ICD-10-CM | POA: Diagnosis not present

## 2020-05-12 DIAGNOSIS — Z0000 Encounter for general adult medical examination without abnormal findings: Secondary | ICD-10-CM

## 2020-05-12 DIAGNOSIS — E663 Overweight: Secondary | ICD-10-CM | POA: Diagnosis not present

## 2020-05-12 DIAGNOSIS — Z1231 Encounter for screening mammogram for malignant neoplasm of breast: Secondary | ICD-10-CM | POA: Diagnosis not present

## 2020-05-12 NOTE — Progress Notes (Signed)
Date:  05/12/2020   Name:  Angela French   DOB:  [birth year]   MRN:  [id number]   Chief Complaint: ref gyn (enlarged uterus)  Patient is a 49 year old female who presents for a comprehensive physical exam. The patient reports the following problems: none. Health maintenance has been reviewed up to date.   Lab Results  Component Value Date   CREATININE 0.79 06/24/2019   BUN 8 06/24/2019   NA 140 06/24/2019   K 4.0 06/24/2019   CL 104 06/24/2019   CO2 22 06/24/2019   Lab Results  Component Value Date   CHOL 129 06/24/2019   HDL 67 06/24/2019   LDLCALC 42 06/24/2019   TRIG 110 06/24/2019   CHOLHDL 1.9 06/24/2019   No results found for: TSH No results found for: HGBA1C No results found for: WBC, HGB, HCT, MCV, PLT Lab Results  Component Value Date   ALT 15 02/07/2017   AST 20 02/07/2017   ALKPHOS 65 02/07/2017   BILITOT 0.5 02/07/2017     Review of Systems  Constitutional: Negative.  Negative for chills, fatigue, fever and unexpected weight change.  HENT: Negative for congestion, ear discharge, ear pain, rhinorrhea, sinus pressure, sneezing and sore throat.   Eyes: Negative for photophobia, pain, discharge, redness and itching.  Respiratory: Negative for cough, shortness of breath, wheezing and stridor.   Gastrointestinal: Negative for abdominal pain, blood in stool, constipation, diarrhea, nausea and vomiting.  Endocrine: Negative for cold intolerance, heat intolerance, polydipsia, polyphagia and polyuria.  Genitourinary: Negative for dysuria, flank pain, frequency, hematuria, menstrual problem, pelvic pain, urgency, vaginal bleeding and vaginal discharge.  Musculoskeletal: Negative for arthralgias, back pain and myalgias.  Skin: Negative for rash.  Allergic/Immunologic: Negative for environmental allergies and food allergies.  Neurological: Negative for dizziness, weakness, light-headedness, numbness and headaches.  Hematological: Negative for adenopathy. Does not  bruise/bleed easily.  Psychiatric/Behavioral: Negative for dysphoric mood. The patient is not nervous/anxious.     Patient Active Problem List   Diagnosis Date Noted  . Obesity 07/26/2017  . Status post arthroscopy of left knee 07/26/2017  . Primary osteoarthritis of left knee 03/29/2017    No Known Allergies  Past Surgical History:  Procedure Laterality Date  . BREAST BIOPSY Left 04/22/2018   US Aspiration, US biopsy and Lymph node biopsy  . BREAST CYST EXCISION Left    neg  . FOOT SURGERY    . KNEE ARTHROSCOPY Left 04/12/2017   Procedure: ARTHROSCOPY KNEE with chonadroplasty with partial menisectomy;  Surgeon: Leanor Kail, MD;  Location: House;  Service: Orthopedics;  Laterality: Left;    Social History   Tobacco Use  . Smoking status: Former Smoker    Types: Cigarettes    Quit date: 01/31/2010    Years since quitting: 10.2  . Smokeless tobacco: Never Used  Substance Use Topics  . Alcohol use: Yes    Alcohol/week: 7.0 standard drinks    Types: 7 Shots of liquor per week  . Drug use: No     Medication list has been reviewed and updated.  Current Meds  Medication Sig  . acetaminophen (TYLENOL) 500 MG tablet Take 1,000 mg by mouth daily.  Marland Kitchen etodolac (LODINE) 500 MG tablet Take 1 tablet (500 mg total) by mouth 2 (two) times daily.  . traMADol (ULTRAM) 50 MG tablet Take by mouth. Dr Jefm Bryant    [facility] 2/9 Scores 05/12/2020 06/24/2019 05/20/2018 03/25/2018  PHQ - 2 Score 0 0 0 2  PHQ-  9 Score 0 - 1 4    GAD 7 : Generalized Anxiety Score 05/12/2020  Nervous, Anxious, on Edge 0  Control/stop worrying 0  Worry too much - different things 0  Trouble relaxing 0  Restless 0  Easily annoyed or irritable 0  Afraid - awful might happen 1  Total GAD 7 Score 1  Anxiety Difficulty Not difficult at all    BP Readings from Last 3 Encounters:  05/12/20 134/62  06/24/19 (!) 152/96  05/20/18 130/80    Physical Exam Vitals and nursing note reviewed.    Constitutional:      Appearance: Normal appearance. She is well-developed, well-groomed and overweight.  HENT:     Head: Normocephalic.     Jaw: There is normal jaw occlusion.     Right Ear: Hearing, tympanic membrane, ear canal and external ear normal. There is no impacted cerumen.     Left Ear: Hearing, tympanic membrane, ear canal and external ear normal. There is no impacted cerumen.     Nose: Nose normal. No congestion or rhinorrhea.     Mouth/Throat:     Lips: Pink.     Mouth: Mucous membranes are moist.     Palate: No mass and lesions.     Pharynx: Oropharynx is clear. Uvula midline.  Eyes:     General: Lids are normal. Lids are everted, no foreign bodies appreciated. Vision grossly intact. No scleral icterus.       Left eye: No foreign body or hordeolum.     Extraocular Movements:     Right eye: Normal extraocular motion.     Left eye: Normal extraocular motion.     Conjunctiva/sclera: Conjunctivae normal.     Right eye: Right conjunctiva is not injected.     Left eye: Left conjunctiva is not injected.     Pupils: Pupils are equal, round, and reactive to light.  Neck:     Thyroid: No thyroid mass or thyromegaly.     Vascular: No carotid bruit or JVD.     Trachea: No tracheal deviation.  Cardiovascular:     Rate and Rhythm: Normal rate and regular rhythm.     Chest Wall: PMI is not displaced.     Pulses:          Carotid pulses are 2+ on the right side and 2+ on the left side.      Radial pulses are 2+ on the right side and 2+ on the left side.       Femoral pulses are 2+ on the right side and 2+ on the left side.      Popliteal pulses are 2+ on the right side and 2+ on the left side.       Dorsalis pedis pulses are 2+ on the right side and 2+ on the left side.       Posterior tibial pulses are 2+ on the right side and 2+ on the left side.     Heart sounds: Normal heart sounds, S1 normal and S2 normal. No murmur heard.  No systolic murmur is present.  No diastolic  murmur is present.  No friction rub. No gallop. No S3 or S4 sounds.   Pulmonary:     Effort: Pulmonary effort is normal. No respiratory distress.     Breath sounds: Normal breath sounds. No stridor. No decreased breath sounds, wheezing, rhonchi or rales.  Chest:     Chest wall: No tenderness.     Breasts:  Right: Normal. No swelling, bleeding, inverted nipple, mass, nipple discharge, skin change or tenderness.        Left: Normal. No swelling, bleeding, inverted nipple, mass, nipple discharge, skin change or tenderness.  Abdominal:     General: Bowel sounds are normal.     Palpations: Abdomen is soft. There is no hepatomegaly, splenomegaly or mass.     Tenderness: There is no abdominal tenderness. There is no right CVA tenderness, left CVA tenderness, guarding or rebound.     Hernia: There is no hernia in the left inguinal area or right inguinal area.  Genitourinary:    Rectum: Normal. Guaiac result negative. No mass.  Musculoskeletal:        General: No tenderness. Normal range of motion.     Cervical back: Normal, normal range of motion and neck supple. No rigidity or tenderness.     Thoracic back: Normal.     Lumbar back: Normal.     Right lower leg: No edema.     Left lower leg: No edema.  Lymphadenopathy:     Head:     Left side of head: No submandibular adenopathy.     Cervical: No cervical adenopathy.     Right cervical: No superficial or deep cervical adenopathy.    Left cervical: No superficial or deep cervical adenopathy.     Upper Body:     Right upper body: No supraclavicular or axillary adenopathy.     Left upper body: No supraclavicular or axillary adenopathy.     Lower Body: No right inguinal adenopathy. No left inguinal adenopathy.  Skin:    General: Skin is warm.     Capillary Refill: Capillary refill takes less than 2 seconds.     Findings: No rash.  Neurological:     Mental Status: She is alert and oriented to person, place, and time.     Cranial  Nerves: Cranial nerves are intact. No cranial nerve deficit.     Deep Tendon Reflexes: Reflexes are normal and symmetric. Reflexes normal.  Psychiatric:        Mood and Affect: Mood is not anxious or depressed.        Behavior: Behavior is cooperative.     Wt Readings from Last 3 Encounters:  05/12/20 202 lb (91.6 kg)  06/24/19 194 lb (88 kg)  05/20/18 202 lb (91.6 kg)    BP 134/62   Pulse 80   Ht 5\' 5"  (1.651 m)   Wt 202 lb (91.6 kg)   BMI 33.61 kg/m   Assessment and Plan: 1. Annual physical exam No subjective/objective concerns noted during history and physical exam.  Patient's chart was reviewed for previous encounters, most recent labs, most recent imaging, and care everywhere.Angela French is a 49 y.o. female who presents today for her Complete Annual Exam. She feels well. She reports exercising occasional. She reports she is sleeping well.Immunizations are reviewed and recommendations provided.   Age appropriate screening tests are discussed. Counseling given for risk factor reduction interventions. labs were obtained including renal function panel lipid panel.  - Renal Function Panel - Lipid Panel With LDL/HDL Ratio  2. Breast cancer screening by mammogram Discussed with patient and mammogram was ordered. - MM 3D SCREEN BREAST BILATERAL; Future  3. History of uterine fibroid Previous exam it was noted that patient had an enlarged uterus that on review of ultrasound had to fibroids that were noted.  Patient was referred to GYN but they were unable after multiple attempts to contact  her.  Patient understands that I would like for her to be followed with this in the future and we have placed a referral with GYN for further follow-up and evaluation. - Ambulatory referral to Gynecology

## 2020-05-13 LAB — RENAL FUNCTION PANEL
Albumin: 4.5 g/dL (ref 3.8–4.8)
BUN/Creatinine Ratio: 11 (ref 9–23)
BUN: 9 mg/dL (ref 6–24)
CO2: 24 mmol/L (ref 20–29)
Calcium: 9.5 mg/dL (ref 8.7–10.2)
Chloride: 100 mmol/L (ref 96–106)
Creatinine, Ser: 0.81 mg/dL (ref 0.57–1.00)
GFR calc Af Amer: 101 mL/min/{1.73_m2} (ref >59)
GFR calc non Af Amer: 87 mL/min/{1.73_m2} (ref >59)
Glucose: 89 mg/dL (ref 65–99)
Phosphorus: 4 mg/dL (ref 3.0–4.3)
Potassium: 4.3 mmol/L (ref 3.5–5.2)
Sodium: 139 mmol/L (ref 134–144)

## 2020-05-13 LAB — LIPID PANEL WITH LDL/HDL RATIO
Cholesterol, Total: 147 mg/dL (ref 100–199)
HDL: 71 mg/dL (ref >39)
LDL Chol Calc (NIH): 58 mg/dL (ref 0–99)
LDL/HDL Ratio: 0.8 ratio (ref 0.0–3.2)
Triglycerides: 96 mg/dL (ref 0–149)
VLDL Cholesterol Cal: 18 mg/dL (ref 5–40)

## 2020-06-09 ENCOUNTER — Encounter: Payer: Self-pay | Admitting: Obstetrics and Gynecology

## 2020-07-12 ENCOUNTER — Ambulatory Visit
Admission: RE | Admit: 2020-07-12 | Discharge: 2020-07-12 | Disposition: A | Discharge status: Home / Self Care | Payer: BC Managed Care – PPO | Source: Ambulatory Visit | Attending: Family Medicine | Admitting: Family Medicine

## 2020-07-12 ENCOUNTER — Other Ambulatory Visit: Payer: Self-pay

## 2020-07-12 DIAGNOSIS — Z1231 Encounter for screening mammogram for malignant neoplasm of breast: Secondary | ICD-10-CM | POA: Insufficient documentation

## 2020-12-27 ENCOUNTER — Ambulatory Visit: Payer: Self-pay | Admitting: *Deleted

## 2020-12-27 ENCOUNTER — Ambulatory Visit: Payer: BC Managed Care – PPO | Admitting: Family Medicine

## 2020-12-27 NOTE — Telephone Encounter (Signed)
Per initial encounter, "Pt is calling and would like to know what to do? Pt had regular period in nov 2021 and in dec 2021 and jan 2022 no period and feb and march 2022 her cycle lasted 14 day each month. Pt has been having headaches"; contacted pt to discuss symptoms; she had pregnancy text negative x 4 tests; her last negative test was 12/23/20; the pt's LMP 08/06/21; her period was due to start on 12/25/20; the pt says she has also been having intermittent headaches x 1 month recommendations made per nurse triage protocol; she verbalized understanding; decision tree completed; pt offered and accept appt with Dr Otilio Miu, [place], 12/27/20 at 1440; will route to office for notification.  Reason for Disposition . Age > 40 years  Answer Assessment - Initial Assessment Questions 1. LMP:  "When did your last menstrual period begin?"     08/06/20 2. DAYS LATE: "How many days late is your menstrual period?"    5 months 3. REGULARITY: "How regular are your periods?"      4. PREGNANCY: "Is there any chance you are pregnant?" (e.g., unprotected intercourse, missed birth control pill, broken condom) "Have you used a home pregnancy test?"    Negative pregnancy test x 4; last tested 12/23/20 5. BREASTFEEDING: "Are you breastfeeding?"      6. BIRTH CONTROL PILLS: "Are you taking birth control pills, or have you stopped recently?"      7. DEPOPROVERA: "Has your doctor given you a shot to prevent pregnancy?" (e.g., Depoprovera injection)    8. CAUSE: "What do you think caused the missed period?" (e.g., stress, rapid weight loss, excessive exercise)     Not sure 9. OTHER SYMPTOMS: "Do you have any other symptoms?" (e.g., abdominal pain)     Intermittent headaches x 1 month  Protocols used: MENSTRUAL PERIOD - MISSED OR LATE-A-AH

## 2021-01-05 ENCOUNTER — Ambulatory Visit (INDEPENDENT_AMBULATORY_CARE_PROVIDER_SITE_OTHER): Payer: BC Managed Care – PPO | Admitting: Obstetrics and Gynecology

## 2021-01-05 ENCOUNTER — Other Ambulatory Visit (HOSPITAL_COMMUNITY)
Admission: RE | Admit: 2021-01-05 | Discharge: 2021-01-05 | Disposition: A | Discharge status: Home / Self Care | Payer: BC Managed Care – PPO | Source: Ambulatory Visit | Attending: Obstetrics and Gynecology | Admitting: Obstetrics and Gynecology

## 2021-01-05 ENCOUNTER — Other Ambulatory Visit: Payer: Self-pay

## 2021-01-05 ENCOUNTER — Encounter: Payer: Self-pay | Admitting: Obstetrics and Gynecology

## 2021-01-05 VITALS — BP 154/99 | Ht 65.0 in | Wt 201.0 lb

## 2021-01-05 DIAGNOSIS — D259 Leiomyoma of uterus, unspecified: Secondary | ICD-10-CM

## 2021-01-05 DIAGNOSIS — Z8619 Personal history of other infectious and parasitic diseases: Secondary | ICD-10-CM

## 2021-01-05 DIAGNOSIS — Z113 Encounter for screening for infections with a predominantly sexual mode of transmission: Secondary | ICD-10-CM | POA: Diagnosis not present

## 2021-01-05 DIAGNOSIS — Z01419 Encounter for gynecological examination (general) (routine) without abnormal findings: Secondary | ICD-10-CM | POA: Diagnosis not present

## 2021-01-05 DIAGNOSIS — N921 Excessive and frequent menstruation with irregular cycle: Secondary | ICD-10-CM

## 2021-01-05 DIAGNOSIS — A5901 Trichomonal vulvovaginitis: Secondary | ICD-10-CM | POA: Diagnosis not present

## 2021-01-05 LAB — RESULTS CONSOLE HPV: CHL HPV: NEGATIVE

## 2021-01-05 NOTE — Progress Notes (Signed)
Obstetrics & Gynecology Office Visit    Chief Complaint  Patient presents with  . Referral  . Menstrual Problem   The patient is seen in referral at the request of Juline Patch, MD from [facility] for menstrual irregularities and fibroids noted on pelvic ultrasound.   History of Present Illness: 50 y.o. [id number] female who is seen in referral from Juline Patch, MD from [facility] for menstrual irregularities and fibroids noted on pelvic ultrasound.    She has been having menstrual irregularities. She missed her period in December 2021 and January 2022. In February and March she had her period and they lasted 14 days.  Prior to December her periods were regular, monthly, lasting 4 days, not heavy, mild cramps.  She had an ultrasound due to what sounds like an abnormal exam.  Her last pap smear was in 2018.  She notes an occasional sort of hot flash.    With her most recent episodes of bleeding they were heavy and she was passing clots. Her periods weren't painful. The periods were heavy the whole time. She states that she went through about 10 pads a day.  She had lost weight and gained some back (about 10 pounds). She denies new constipation, new bloating. She notes some mild early satiety.  She had trichomonas about 4 years ago that was not treated, but did not show up on a wet prep.  She was treated for bacterial vaginosis and this should have cleared up both BV and trichomonas. However, she states her partner was never treated. So, she is at an undetermined risk.    History of fibroid uterus: Ultrasound report from 01/2017: Uterus: 16 x 8.5 x 10.9 cm  Anterior fundal fibroid: 7.6 x 8.1 x 8.9 cm  Anterior lower uterine segment fibroid: 6.2 x 5.8 x 5.7 cm  Endometrium: 15 mm Right ovary: not visualized Left ovary: not visualized Other findings: no abnormal free fluid   Health Maintenance: Last pap smear: 02/07/2017: NILM, HPV negative, trichomonas  noted Last mammogram: 07/12/2020: Birads 1 (negative) Colorectal cancer screen: does not appear to have had. She denies.   Past Medical History:  Diagnosis Date  . Arthritis    knee  . Wears contact lenses     Past Surgical History:  Procedure Laterality Date  . BREAST BIOPSY Left 04/22/2018   US Aspiration, US biopsy and Lymph node biopsy  . BREAST CYST EXCISION Left    neg  . FOOT SURGERY    . KNEE ARTHROSCOPY Left 04/12/2017   Procedure: ARTHROSCOPY KNEE with chonadroplasty with partial menisectomy;  Surgeon: Leanor Kail, MD;  Location: [place];  Service: Orthopedics;  Laterality: Left;    Gynecologic History: Patient's last menstrual period was 01/01/2021.  Obstetric History: [id number], SVD x 3, SAB x 1 (with D&C), EAB x 1.  Family History  Problem Relation Age of Onset  . Diabetes Mother   . Hypertension Father   . Hypertension Sister   . Breast cancer Sister 15  . Breast cancer Paternal Grandmother     Social History   Socioeconomic History  . Marital status: Single    Spouse name: Not on file  . Number of children: Not on file  . Years of education: Not on file  . Highest education level: Not on file  Occupational History  . Not on file  Tobacco Use  . Smoking status: Former Smoker    Types: Cigarettes    Quit date: 01/31/2010  Years since quitting: 10.9  . Smokeless tobacco: Never Used  Substance and Sexual Activity  . Alcohol use: Yes    Alcohol/week: 7.0 standard drinks    Types: 7 Shots of liquor per week  . Drug use: No  . Sexual activity: Yes    Birth control/protection: None  Other Topics Concern  . Not on file  Social History Narrative  . Not on file   Social Determinants of Health   Financial Resource Strain: Not on file  Food Insecurity: Not on file  Transportation Needs: Not on file  Physical Activity: Not on file  Stress: Not on file  Social Connections: Not on file  Intimate Partner Violence: Not on file     No Known Allergies  Prior to Admission medications   Medication Sig Start Date End Date Taking? Authorizing Provider  acetaminophen (TYLENOL) 500 MG tablet Take 1,000 mg by mouth daily.    [provider]    Review of Systems  Constitutional: Negative.   HENT: Negative.   Eyes: Negative.   Respiratory: Negative.   Cardiovascular: Negative.   Gastrointestinal: Negative.   Genitourinary: Negative.   Musculoskeletal: Negative.   Skin: Negative.   Neurological: Negative.   Psychiatric/Behavioral: Negative.      Physical Exam BP (!) 154/99   Ht 5\' 5"  (1.651 m)   Wt 201 lb (91.2 kg)   LMP 01/01/2021   BMI 33.45 kg/m  Patient's last menstrual period was 01/01/2021. Physical Exam Constitutional:      General: She is not in acute distress.    Appearance: Normal appearance. She is well-developed.  Genitourinary:     Vulva, bladder and urethral meatus normal.     Right Labia: No rash, tenderness, lesions, skin changes or Bartholin's cyst.    Left Labia: No tenderness, skin changes, Bartholin's cyst or rash.    No inguinal adenopathy present in the right or left side.    Pelvic Tanner Score: 5/5.    Vaginal bleeding (old blood in vault) present.      Right Adnexa: not tender, not full and no mass present.    Left Adnexa: not tender, not full and no mass present.    No cervical motion tenderness, friability, lesion or polyp.     Uterus is enlarged and irregular.     Uterus is not fixed or tender.     Uterine mass present.    Uterus exam comments: Uterus extends to U-3 on bimanual. .     Uterus is anteverted.     No urethral tenderness or mass present.     Pelvic exam was performed with patient in the lithotomy position.  HENT:     Head: Normocephalic and atraumatic.  Eyes:     General: No scleral icterus.    Conjunctiva/sclera: Conjunctivae normal.  Cardiovascular:     Rate and Rhythm: Normal rate and regular rhythm.     Heart sounds: No murmur heard. No  friction rub. No gallop.   Pulmonary:     Effort: Pulmonary effort is normal. No respiratory distress.     Breath sounds: Normal breath sounds. No wheezing or rales.  Abdominal:     General: Bowel sounds are normal. There is no distension.     Palpations: Abdomen is soft. There is no mass.     Tenderness: There is no abdominal tenderness. There is no guarding or rebound.     Hernia: There is no hernia in the left inguinal area or right inguinal area.  Musculoskeletal:  General: Normal range of motion.     Cervical back: Normal range of motion and neck supple.  Lymphadenopathy:     Lower Body: No right inguinal adenopathy. No left inguinal adenopathy.  Neurological:     General: No focal deficit present.     Mental Status: She is alert and oriented to person, place, and time.     Cranial Nerves: No cranial nerve deficit.  Skin:    General: Skin is warm and dry.     Findings: No erythema.  Psychiatric:        Mood and Affect: Mood normal.        Behavior: Behavior normal.        Judgment: Judgment normal.   Female chaperone present for pelvic and breast  portions of the physical exam  Wet Prep: PH: not done due to bleeding Clue Cells: Negative Fungal elements: Negative Trichomonas: Negative   Assessment: 50 y.o. G93P0 female here for  1. Menorrhagia with irregular cycle   2. Uterine leiomyoma, unspecified location   3. History of trichomoniasis      Plan: Problem List Items Addressed This Visit   None   Visit Diagnoses    Menorrhagia with irregular cycle    -  Primary   Relevant Orders   US PELVIC COMPLETE WITH TRANSVAGINAL   Cytology - PAP   Uterine leiomyoma, unspecified location       Relevant Orders   US PELVIC COMPLETE WITH TRANSVAGINAL   Cytology - PAP   History of trichomoniasis         Discussed management options for abnormal uterine bleeding including expectant, NSAIDs, tranexamic acid (Lysteda), oral progesterone (Provera, norethindrone, megace),  Depo Provera, Levonorgestrel containing IUD, endometrial ablation (Novasure) or hysterectomy as definitive surgical management.  Discussed risks and benefits of each method.   Final management decision will hinge on results of patient's work up and whether an underlying etiology for the patients bleeding symptoms can be discerned.  We will conduct a basic work up examining using the PALM-COIEN classification system.  In the meantime the patient opts to trial no medication while we await results of her ultrasound and labs.  Printed patient education handouts were given to the patient to review at home.  Bleeding precautions reviewed.   Pap smear with STD screen. Will obtain a pelvic ultrasound. Possible need for endometrial biopsy discussed.   A total of 34 minutes were spent face-to-face with the patient as well as preparation, review, communication, and documentation during this encounter.    Prentice Docker, MD 01/05/2021 9:42 AM    CC: Juline Patch, MD [address] [place],  [place] [address]

## 2021-01-09 ENCOUNTER — Encounter: Payer: Self-pay | Admitting: Obstetrics and Gynecology

## 2021-01-09 LAB — CERVICOVAGINAL ANCILLARY ONLY
Chlamydia: NEGATIVE
Comment: NEGATIVE
Comment: NEGATIVE
Comment: NORMAL
Neisseria Gonorrhea: NEGATIVE
Trichomonas: POSITIVE — AB

## 2021-01-11 LAB — CYTOLOGY - PAP
Comment: NEGATIVE
Diagnosis: NEGATIVE
High risk HPV: NEGATIVE

## 2021-01-17 ENCOUNTER — Telehealth: Payer: Self-pay | Admitting: Obstetrics and Gynecology

## 2021-01-18 NOTE — Telephone Encounter (Signed)
Tried to call pt this AM, no answer. Cannot leave message

## 2021-01-19 NOTE — Telephone Encounter (Signed)
Trying to call pt again today please let me know when/if she calls back

## 2021-01-20 ENCOUNTER — Encounter: Payer: Self-pay | Admitting: Obstetrics and Gynecology

## 2021-01-20 ENCOUNTER — Other Ambulatory Visit: Payer: Self-pay | Admitting: Obstetrics and Gynecology

## 2021-01-20 DIAGNOSIS — A599 Trichomoniasis, unspecified: Secondary | ICD-10-CM

## 2021-01-20 MED ORDER — METRONIDAZOLE 500 MG PO TABS: 2000.0000 mg | ORAL_TABLET | Freq: Once | ORAL | 0 refills | Status: AC

## 2021-01-20 NOTE — Telephone Encounter (Signed)
Tried to  Call pt again this AM, pt answered but did not say anything and lost service. Do you want to send a letter

## 2021-01-20 NOTE — Telephone Encounter (Signed)
I created a letter and forwarded it to you. Please mail this to her. I also sent in the prescription for her STD.

## 2021-01-20 NOTE — Progress Notes (Signed)
error 

## 2021-01-23 NOTE — Telephone Encounter (Signed)
sent 

## 2021-02-22 ENCOUNTER — Ambulatory Visit: Payer: BC Managed Care – PPO

## 2021-02-23 ENCOUNTER — Ambulatory Visit: Payer: BC Managed Care – PPO | Admitting: Obstetrics and Gynecology

## 2021-08-05 DIAGNOSIS — S8391XA Sprain of unspecified site of right knee, initial encounter: Secondary | ICD-10-CM | POA: Diagnosis not present

## 2021-09-14 DIAGNOSIS — S8391XA Sprain of unspecified site of right knee, initial encounter: Secondary | ICD-10-CM | POA: Diagnosis not present

## 2021-12-11 ENCOUNTER — Encounter: Payer: BC Managed Care – PPO | Admitting: Family Medicine

## 2022-01-04 ENCOUNTER — Encounter: Payer: Self-pay | Admitting: Family Medicine

## 2022-01-04 ENCOUNTER — Ambulatory Visit (INDEPENDENT_AMBULATORY_CARE_PROVIDER_SITE_OTHER): Payer: BC Managed Care – PPO | Admitting: Family Medicine

## 2022-01-04 ENCOUNTER — Telehealth: Payer: Self-pay

## 2022-01-04 ENCOUNTER — Other Ambulatory Visit: Payer: Self-pay

## 2022-01-04 VITALS — BP 138/94 | HR 72 | Ht 65.0 in | Wt 200.0 lb

## 2022-01-04 DIAGNOSIS — Z0000 Encounter for general adult medical examination without abnormal findings: Secondary | ICD-10-CM | POA: Diagnosis not present

## 2022-01-04 DIAGNOSIS — R7889 Finding of other specified substances, not normally found in blood: Secondary | ICD-10-CM | POA: Diagnosis not present

## 2022-01-04 DIAGNOSIS — R030 Elevated blood-pressure reading, without diagnosis of hypertension: Secondary | ICD-10-CM

## 2022-01-04 DIAGNOSIS — Z8619 Personal history of other infectious and parasitic diseases: Secondary | ICD-10-CM | POA: Diagnosis not present

## 2022-01-04 DIAGNOSIS — Z1211 Encounter for screening for malignant neoplasm of colon: Secondary | ICD-10-CM

## 2022-01-04 DIAGNOSIS — N6459 Other signs and symptoms in breast: Secondary | ICD-10-CM | POA: Diagnosis not present

## 2022-01-04 LAB — HEMOCCULT GUIAC POC 1CARD (OFFICE): Fecal Occult Blood, POC: NEGATIVE

## 2022-01-04 MED ORDER — DOXYCYCLINE HYCLATE 100 MG PO TABS: 100.0000 mg | ORAL_TABLET | Freq: Two times a day (BID) | ORAL | 0 refills | Status: DC

## 2022-01-04 MED ORDER — NA SULFATE-K SULFATE-MG SULF 17.5-3.13-1.6 GM/177ML PO SOLN: 1.0000 | Freq: Once | ORAL | 0 refills | Status: AC

## 2022-01-04 MED ORDER — HYDROCHLOROTHIAZIDE 12.5 MG PO TABS: 12.5000 mg | ORAL_TABLET | Freq: Every day | ORAL | 0 refills | Status: DC

## 2022-01-04 NOTE — Telephone Encounter (Signed)
Called pt with mammo appt. May 9th @ 9:40 with [place] to follow at [facility] ?

## 2022-01-04 NOTE — Progress Notes (Signed)
Gastroenterology Pre-Procedure Review ? ?Request Date: 01/26/22 ?Requesting Physician: Dr. Allen Norris ? ?PATIENT REVIEW QUESTIONS: The patient responded to the following health history questions as indicated:   ? ?1. Are you having any GI issues? no ?2. Do you have a personal history of Polyps? no ?3. Do you have a family history of Colon Cancer or Polyps? no ?4. Diabetes Mellitus? no ?5. Joint replacements in the past 12 months?no ?6. Major health problems in the past 3 months?no ?7. Any artificial heart valves, MVP, or defibrillator?no ?   ?MEDICATIONS & ALLERGIES:    ?Patient reports the following regarding taking any anticoagulation/antiplatelet therapy:   ?Plavix, Coumadin, Eliquis, Xarelto, Lovenox, Pradaxa, Brilinta, or Effient? no ?Aspirin? no ? ?Patient confirms/reports the following medications:  ?Current Outpatient Medications  ?Medication Sig Dispense Refill  ? acetaminophen (TYLENOL) 500 MG tablet Take 1,000 mg by mouth daily.    ? doxycycline (VIBRA-TABS) 100 MG tablet Take 1 tablet (100 mg total) by mouth 2 (two) times daily. 20 tablet 0  ? etodolac (LODINE) 500 MG tablet Take 1 tablet (500 mg total) by mouth 2 (two) times daily. (Patient not taking: Reported on 01/05/2021) 60 tablet 1  ? hydrochlorothiazide (HYDRODIURIL) 12.5 MG tablet Take 1 tablet (12.5 mg total) by mouth daily. 90 tablet 0  ? traMADol (ULTRAM) 50 MG tablet Take by mouth. Dr Jefm Bryant    ? ?No current facility-administered medications for this visit.  ? ? ?Patient confirms/reports the following allergies:  ?No Known Allergies ? ?No orders of the defined types were placed in this encounter. ? ? ?AUTHORIZATION INFORMATION ?Primary Insurance: ?1D#: ?Group #: ? ?Secondary Insurance: ?1D#: ?Group #: ? ?SCHEDULE INFORMATION: ?Date: 01/26/22 ?Time: ?Location: [place] ?

## 2022-01-04 NOTE — Patient Instructions (Signed)

## 2022-01-04 NOTE — Progress Notes (Signed)
? ? ?Date:  01/04/2022  ? ?Name:  Angela French   DOB:  [birth year]   MRN:  [id number] ? ? ?Chief Complaint: Annual Exam (No pap, needs breast exam), place on L) breast (Comes up and pops/ oozes- has happened "at least two times"), and Colon Cancer Screening ? ?Patient is a 51 year old female who presents for a comprehensive physical exam. The patient reports the following problems: BP concern. Health maintenance has been reviewed needs colonoscopy. ?  ? ? ?Lab Results  ?Component Value Date  ? NA 139 05/12/2020  ? K 4.3 05/12/2020  ? CO2 24 05/12/2020  ? GLUCOSE 89 05/12/2020  ? BUN 9 05/12/2020  ? CREATININE 0.81 05/12/2020  ? CALCIUM 9.5 05/12/2020  ? GFRNONAA 87 05/12/2020  ? ?Lab Results  ?Component Value Date  ? CHOL 147 05/12/2020  ? HDL 71 05/12/2020  ? [place] 58 05/12/2020  ? TRIG 96 05/12/2020  ? CHOLHDL 1.9 06/24/2019  ? ?No results found for: TSH ?No results found for: HGBA1C ?No results found for: WBC, HGB, HCT, MCV, PLT ?Lab Results  ?Component Value Date  ? ALT 15 02/07/2017  ? AST 20 02/07/2017  ? ALKPHOS 65 02/07/2017  ? BILITOT 0.5 02/07/2017  ? ?No results found for: 25OHVITD2, [place], VD25OH  ? ?Review of Systems  ?Constitutional:  Negative for chills and fever.  ?HENT:  Negative for drooling, ear discharge, ear pain and sore throat.   ?Respiratory:  Negative for cough, shortness of breath and wheezing.   ?Cardiovascular:  Negative for chest pain, palpitations and leg swelling.  ?Gastrointestinal:  Negative for abdominal pain, blood in stool, constipation, diarrhea and nausea.  ?Endocrine: Negative for polydipsia.  ?Genitourinary:  Negative for dysuria, frequency, hematuria and urgency.  ?Musculoskeletal:  Negative for back pain, myalgias and neck pain.  ?Skin:  Negative for rash.  ?Allergic/Immunologic: Negative for environmental allergies.  ?Neurological:  Negative for dizziness and headaches.  ?Hematological:  Does not bruise/bleed easily.  ?Psychiatric/Behavioral:  Negative for suicidal  ideas. The patient is not nervous/anxious.   ? ?Patient Active Problem List  ? Diagnosis Date Noted  ? Obesity 07/26/2017  ? Status post arthroscopy of left knee 07/26/2017  ? Primary osteoarthritis of left knee 03/29/2017  ? ? ?No Known Allergies ? ?Past Surgical History:  ?Procedure Laterality Date  ? BREAST BIOPSY Left 04/22/2018  ? US Aspiration, US biopsy and Lymph node biopsy  ? BREAST CYST EXCISION Left   ? neg  ? FOOT SURGERY    ? KNEE ARTHROSCOPY Left 04/12/2017  ? Procedure: ARTHROSCOPY KNEE with chonadroplasty with partial menisectomy;  Surgeon: Leanor Kail, MD;  Location: [facility];  Service: Orthopedics;  Laterality: Left;  ? ? ?Social History  ? ?Tobacco Use  ? Smoking status: Former  ?  Types: Cigarettes  ?  Quit date: 01/31/2010  ?  Years since quitting: 11.9  ? Smokeless tobacco: Never  ?Substance Use Topics  ? Alcohol use: Yes  ?  Alcohol/week: 7.0 standard drinks  ?  Types: 7 Shots of liquor per week  ? Drug use: No  ? ? ? ?Medication list has been reviewed and updated. ? ?Current Meds  ?Medication Sig  ? acetaminophen (TYLENOL) 500 MG tablet Take 1,000 mg by mouth daily.  ? traMADol (ULTRAM) 50 MG tablet Take by mouth. Dr Jefm Bryant  ? ? ? ?  01/04/2022  ?  9:20 AM 05/12/2020  ? 10:15 AM  ?GAD 7 : Generalized Anxiety Score  ?Nervous, Anxious, on  Edge 0 0  ?Control/stop worrying 0 0  ?Worry too much - different things 0 0  ?Trouble relaxing 0 0  ?Restless 0 0  ?Easily annoyed or irritable 0 0  ?Afraid - awful might happen 0 1  ?Total GAD 7 Score 0 1  ?Anxiety Difficulty Not difficult at all Not difficult at all  ? ? ? ?  01/04/2022  ?  9:20 AM  ?Depression screen PHQ 2/9  ?Decreased Interest 0  ?Down, Depressed, Hopeless 0  ?PHQ - 2 Score 0  ?Altered sleeping 0  ?Tired, decreased energy 0  ?Change in appetite 0  ?Feeling bad or failure about yourself  0  ?Trouble concentrating 0  ?Moving slowly or fidgety/restless 0  ?Suicidal thoughts 0  ?PHQ-9 Score 0  ?Difficult doing work/chores Not  difficult at all  ? ? ?BP Readings from Last 3 Encounters:  ?01/04/22 (!) 148/102  ?01/05/21 (!) 154/99  ?05/12/20 134/62  ? ? ?Physical Exam ?Vitals and nursing note reviewed.  ?Constitutional:   ?   Appearance: She is well-developed.  ?HENT:  ?   Head: Normocephalic.  ?   Jaw: There is normal jaw occlusion.  ?   Right Ear: Hearing, tympanic membrane, ear canal and external ear normal. There is no impacted cerumen.  ?   Left Ear: Hearing, tympanic membrane, ear canal and external ear normal. There is no impacted cerumen.  ?   Nose: Nose normal.  ?   Mouth/Throat:  ?   Lips: Pink.  ?   Mouth: Mucous membranes are moist.  ?   Dentition: Normal dentition.  ?   Tongue: No lesions.  ?   Palate: No mass.  ?   Pharynx: Oropharynx is clear. Uvula midline.  ?Eyes:  ?   General: Lids are normal. Vision grossly intact. Gaze aligned appropriately. No scleral icterus.    ?   Left eye: No foreign body or hordeolum.  ?   Conjunctiva/sclera: Conjunctivae normal.  ?   Right eye: Right conjunctiva is not injected.  ?   Left eye: Left conjunctiva is not injected.  ?   Pupils: Pupils are equal, round, and reactive to light.  ?   Funduscopic exam: ?   Right eye: Red reflex present.     ?   Left eye: Red reflex present. ?Neck:  ?   Thyroid: No thyroid mass, thyromegaly or thyroid tenderness.  ?   Vascular: Normal carotid pulses. No carotid bruit, hepatojugular reflux or JVD.  ?   Trachea: Trachea and phonation normal. No tracheal deviation.  ?Cardiovascular:  ?   Rate and Rhythm: Normal rate and regular rhythm.  ?   Pulses:     ?     Carotid pulses are 2+ on the right side and 2+ on the left side. ?     Radial pulses are 2+ on the right side and 2+ on the left side.  ?     Femoral pulses are 2+ on the right side and 2+ on the left side. ?     Popliteal pulses are 2+ on the right side and 2+ on the left side.  ?     Dorsalis pedis pulses are 2+ on the right side and 2+ on the left side.  ?     Posterior tibial pulses are 2+ on the right  side and 2+ on the left side.  ?   Heart sounds: Normal heart sounds, S1 normal and S2 normal. No murmur heard. ?No systolic  murmur is present.  ?No diastolic murmur is present.  ?  No friction rub. No gallop. No S3 or S4 sounds.  ?Pulmonary:  ?   Effort: Pulmonary effort is normal. No respiratory distress.  ?   Breath sounds: Normal breath sounds. No decreased breath sounds, wheezing, rhonchi or rales.  ?Chest:  ?Breasts: ?   Right: No swelling, bleeding, inverted nipple, mass, nipple discharge, skin change or tenderness.  ?   Left: Mass and skin change present. No swelling, bleeding, inverted nipple, nipple discharge or tenderness.  ? ? ?   Comments: ? Induration chronic mastitis superior to left nipple ?Abdominal:  ?   General: Bowel sounds are normal.  ?   Palpations: Abdomen is soft. There is no hepatomegaly, splenomegaly or mass.  ?   Tenderness: There is no abdominal tenderness. There is no guarding or rebound.  ?Genitourinary: ?   Rectum: Normal. Guai[place] result negative. No mass.  ?Musculoskeletal:     ?   General: No tenderness. Normal range of motion.  ?   Cervical back: Full passive range of motion without pain, normal range of motion and neck supple.  ?   Right lower leg: No edema.  ?   Left lower leg: No edema.  ?Lymphadenopathy:  ?   Head:  ?   Right side of head: No submental or submandibular adenopathy.  ?   Left side of head: No submental or submandibular adenopathy.  ?   Cervical: No cervical adenopathy.  ?   Right cervical: No superficial, deep or posterior cervical adenopathy. ?   Left cervical: No superficial, deep or posterior cervical adenopathy.  ?   Upper Body:  ?   Right upper body: No supraclavicular or axillary adenopathy.  ?   Left upper body: No supraclavicular or axillary adenopathy.  ?   Lower Body: No right inguinal adenopathy. No left inguinal adenopathy.  ?Skin: ?   General: Skin is warm.  ?   Findings: No rash.  ?Neurological:  ?   Mental Status: She is alert and oriented to  person, place, and time.  ?   Cranial Nerves: Cranial nerves 2-12 are intact. No cranial nerve deficit.  ?   Sensory: Sensation is intact.  ?   Motor: Motor function is intact.  ?   Deep Tendon Reflexes: Reflex

## 2022-01-05 LAB — LIPID PANEL WITH LDL/HDL RATIO
Cholesterol, Total: 106 mg/dL (ref 100–199)
HDL: 54 mg/dL (ref >39)
LDL Chol Calc (NIH): 34 mg/dL (ref 0–99)
LDL/HDL Ratio: 0.6 ratio (ref 0.0–3.2)
Triglycerides: 95 mg/dL (ref 0–149)
VLDL Cholesterol Cal: 18 mg/dL (ref 5–40)

## 2022-01-05 LAB — RENAL FUNCTION PANEL
Albumin: 4.2 g/dL (ref 3.8–4.8)
BUN/Creatinine Ratio: 6 — ABNORMAL LOW (ref 9–23)
BUN: 5 mg/dL — ABNORMAL LOW (ref 6–24)
CO2: 21 mmol/L (ref 20–29)
Calcium: 9.3 mg/dL (ref 8.7–10.2)
Chloride: 103 mmol/L (ref 96–106)
Creatinine, Ser: 0.82 mg/dL (ref 0.57–1.00)
Glucose: 82 mg/dL (ref 70–99)
Phosphorus: 3.3 mg/dL (ref 3.0–4.3)
Potassium: 4.4 mmol/L (ref 3.5–5.2)
Sodium: 140 mmol/L (ref 134–144)
eGFR: 88 mL/min/{1.73_m2} (ref >59)

## 2022-01-15 ENCOUNTER — Ambulatory Visit: Payer: BC Managed Care – PPO | Admitting: Surgery

## 2022-01-23 ENCOUNTER — Encounter: Payer: Self-pay | Admitting: Gastroenterology

## 2022-01-23 ENCOUNTER — Other Ambulatory Visit: Payer: BC Managed Care – PPO

## 2022-01-26 ENCOUNTER — Encounter: Payer: Self-pay | Admitting: Gastroenterology

## 2022-01-26 ENCOUNTER — Ambulatory Visit: Payer: BC Managed Care – PPO | Admitting: Anesthesiology

## 2022-01-26 ENCOUNTER — Other Ambulatory Visit: Payer: Self-pay

## 2022-01-26 ENCOUNTER — Encounter
Admission: RE | Disposition: A | Discharge status: Home / Self Care | Payer: Self-pay | Source: Home / Self Care | Attending: Gastroenterology

## 2022-01-26 ENCOUNTER — Ambulatory Visit
Admission: RE | Admit: 2022-01-26 | Discharge: 2022-01-26 | Disposition: A | Discharge status: Home / Self Care | Payer: BC Managed Care – PPO | Attending: Gastroenterology | Admitting: Gastroenterology

## 2022-01-26 DIAGNOSIS — I10 Essential (primary) hypertension: Secondary | ICD-10-CM | POA: Insufficient documentation

## 2022-01-26 DIAGNOSIS — K640 First degree hemorrhoids: Secondary | ICD-10-CM | POA: Diagnosis not present

## 2022-01-26 DIAGNOSIS — Z87891 Personal history of nicotine dependence: Secondary | ICD-10-CM | POA: Diagnosis not present

## 2022-01-26 DIAGNOSIS — Z1211 Encounter for screening for malignant neoplasm of colon: Secondary | ICD-10-CM

## 2022-01-26 DIAGNOSIS — K5730 Diverticulosis of large intestine without perforation or abscess without bleeding: Secondary | ICD-10-CM | POA: Diagnosis not present

## 2022-01-26 HISTORY — PX: COLONOSCOPY WITH PROPOFOL: SHX5780

## 2022-01-26 HISTORY — DX: Essential (primary) hypertension: I10

## 2022-01-26 LAB — POCT PREGNANCY, URINE: Preg Test, Ur: NEGATIVE

## 2022-01-26 SURGERY — COLONOSCOPY WITH PROPOFOL
Anesthesia: General | Site: Rectum

## 2022-01-26 MED ORDER — OXYCODONE HCL 5 MG/5ML PO SOLN: 5.0000 mg | Freq: Once | ORAL | Status: DC | PRN

## 2022-01-26 MED ORDER — OXYCODONE HCL 5 MG PO TABS: 5.0000 mg | ORAL_TABLET | Freq: Once | ORAL | Status: DC | PRN

## 2022-01-26 MED ORDER — LIDOCAINE HCL (CARDIAC) PF 100 MG/5ML IV SOSY
PREFILLED_SYRINGE | INTRAVENOUS | Status: DC | PRN
  Administered 2022-01-26: 30 mg via INTRAVENOUS

## 2022-01-26 MED ORDER — LACTATED RINGERS IV SOLN: INTRAVENOUS | Status: DC

## 2022-01-26 MED ORDER — FENTANYL CITRATE PF 50 MCG/ML IJ SOSY: 25.0000 ug | PREFILLED_SYRINGE | INTRAMUSCULAR | Status: DC | PRN

## 2022-01-26 MED ORDER — MEPERIDINE HCL 25 MG/ML IJ SOLN: 6.2500 mg | INTRAMUSCULAR | Status: DC | PRN

## 2022-01-26 MED ORDER — STERILE WATER FOR IRRIGATION IR SOLN
Status: DC | PRN
  Administered 2022-01-26: 150 mL

## 2022-01-26 MED ORDER — SODIUM CHLORIDE 0.9 % IV SOLN: INTRAVENOUS | Status: DC

## 2022-01-26 MED ORDER — PROPOFOL 10 MG/ML IV BOLUS
INTRAVENOUS | Status: DC | PRN
  Administered 2022-01-26: 150 mg via INTRAVENOUS
  Administered 2022-01-26 (×5): 30 mg via INTRAVENOUS

## 2022-01-26 SURGICAL SUPPLY — 22 items

## 2022-01-26 NOTE — H&P (Signed)
? ?[name], MD [facility] ?[address] ?[place], [place] [address] ?Phone: [phone] ?Fax : [phone] ? ?Primary Care Physician:  Juline Patch, MD ?Primary Gastroenterologist:  Dr. Allen Norris ? ?Pre-Procedure History & Physical: ?HPI:  Angela French is a 51 y.o. female is here for a screening colonoscopy.  ? ?Past Medical History:  ?Diagnosis Date  ? Arthritis   ? knee  ? Hypertension   ? Wears contact lenses   ? ? ?Past Surgical History:  ?Procedure Laterality Date  ? BREAST BIOPSY Left 04/22/2018  ? US Aspiration, US biopsy and Lymph node biopsy  ? BREAST CYST EXCISION Left   ? neg  ? FOOT SURGERY    ? KNEE ARTHROSCOPY Left 04/12/2017  ? Procedure: ARTHROSCOPY KNEE with chonadroplasty with partial menisectomy;  Surgeon: Leanor Kail, MD;  Location: [place] [facility];  Service: Orthopedics;  Laterality: Left;  ? ? ?Prior to Admission medications   ?Medication Sig Start Date End Date Taking? Authorizing Provider  ?hydrochlorothiazide (HYDRODIURIL) 12.5 MG tablet Take 1 tablet (12.5 mg total) by mouth daily. 01/04/22  Yes Juline Patch, MD  ?OVER THE COUNTER MEDICATION daily. [place]   Yes [provider]  ?acetaminophen (TYLENOL) 500 MG tablet Take 1,000 mg by mouth daily.    [provider]  ?doxycycline (VIBRA-TABS) 100 MG tablet Take 1 tablet (100 mg total) by mouth 2 (two) times daily. ?Patient not taking: Reported on 01/23/2022 01/04/22   Juline Patch, MD  ? ? ?Allergies as of 01/04/2022  ? (No Known Allergies)  ? ? ?Family History  ?Problem Relation Age of Onset  ? Diabetes Mother   ? Hypertension Father   ? Hypertension Sister   ? Breast cancer Sister 24  ? Breast cancer Paternal Grandmother   ? ? ?Social History  ? ?Socioeconomic History  ? Marital status: Single  ?  Spouse name: Not on file  ? Number of children: Not on file  ? Years of education: Not on file  ? Highest education level: Not on file  ?Occupational History  ? Not on file  ?Tobacco Use  ? Smoking status:  Former  ?  Types: Cigarettes  ?  Quit date: 01/31/2010  ?  Years since quitting: 11.9  ? Smokeless tobacco: Never  ?Vaping Use  ? Vaping Use: Never used  ?Substance and Sexual Activity  ? Alcohol use: Yes  ?  Alcohol/week: 7.0 standard drinks  ?  Types: 7 Shots of liquor per week  ? Drug use: No  ? Sexual activity: Yes  ?  Birth control/protection: None  ?Other Topics Concern  ? Not on file  ?Social History Narrative  ? Not on file  ? ?Social Determinants of Health  ? ?Financial Resource Strain: Not on file  ?Food Insecurity: Not on file  ?Transportation Needs: Not on file  ?Physical Activity: Not on file  ?Stress: Not on file  ?Social Connections: Not on file  ?Intimate Partner Violence: Not on file  ? ? ?Review of Systems: ?See HPI, otherwise negative ROS ? ?Physical Exam: ?BP (!) 158/97   Pulse 72   Temp 97.7 ?F (36.5 ?C) (Temporal)   Ht '5\' 5"'$  (1.651 m)   Wt 88.4 kg   LMP 01/21/2022 (Exact Date) Comment: preg test neg  SpO2 100%   BMI 32.42 kg/m?  ?General:   Alert,  pleasant and cooperative in NAD ?Head:  Normocephalic and atraumatic. ?Neck:  Supple; no masses or thyromegaly. ?Lungs:  Clear throughout to auscultation.    ?  Heart:  Regular rate and rhythm. ?Abdomen:  Soft, nontender and nondistended. Normal bowel sounds, without guarding, and without rebound.   ?Neurologic:  Alert and  oriented x4;  grossly normal neurologically. ? ?Impression/Plan: ?Angela French is now here to undergo a screening colonoscopy. ? ?Risks, benefits, and alternatives regarding colonoscopy have been reviewed with the patient.  Questions have been answered.  All parties agreeable. ?

## 2022-01-26 NOTE — Anesthesia Postprocedure Evaluation (Signed)
Anesthesia Post Note ? ?Patient: Angela French ? ?Procedure(s) Performed: COLONOSCOPY WITH PROPOFOL (Rectum) ? ? ?  ?Patient location during evaluation: PACU ?Anesthesia Type: General ?Level of consciousness: awake and alert ?Pain management: pain level controlled ?Vital Signs Assessment: post-procedure vital signs reviewed and stable ?Respiratory status: spontaneous breathing, nonlabored ventilation, respiratory function stable and patient connected to nasal cannula oxygen ?Cardiovascular status: blood pressure returned to baseline and stable ?Postop Assessment: no apparent nausea or vomiting ?Anesthetic complications: no ? ? ?No notable events documented. ? ?Denean Pavon ELAINE ? ? ? ? ? ?

## 2022-01-26 NOTE — Anesthesia Procedure Notes (Signed)
Date/Time: 01/26/2022 8:16 AM ?Performed by: Cameron Ali, CRNA ?Pre-anesthesia Checklist: Patient identified, Emergency Drugs available, Suction available, Timeout performed and Patient being monitored ?Patient Re-evaluated:Patient Re-evaluated prior to induction ?Oxygen Delivery Method: Nasal cannula ?Placement Confirmation: positive ETCO2 ? ? ? ? ?

## 2022-01-26 NOTE — Transfer of Care (Signed)
Immediate Anesthesia Transfer of Care Note ? ?Patient: Angela French ? ?Procedure(s) Performed: COLONOSCOPY WITH PROPOFOL (Rectum) ? ?Patient Location: PACU ? ?Anesthesia Type: General ? ?Level of Consciousness: awake, alert  and patient cooperative ? ?Airway and Oxygen Therapy: Patient Spontanous Breathing and Patient connected to supplemental oxygen ? ?Post-op Assessment: Post-op Vital signs reviewed, Patient's Cardiovascular Status Stable, Respiratory Function Stable, Patent Airway and No signs of Nausea or vomiting ? ?Post-op Vital Signs: Reviewed and stable ? ?Complications: No notable events documented. ? ?

## 2022-01-26 NOTE — Op Note (Addendum)
[facility] ?Gastroenterology ?Patient Name: Angela French ?Procedure Date: 01/26/2022 8:02 AM ?MRN: [id number] ?Account #: [id number] ?Date of Birth: Jan 02, 1971 ?Admit Type: Outpatient ?Age: 51 ?Room: [facility] OR ROOM 01 ?Gender: Female ?Note Status: Finalized ?Instrument Name: [id number] ?Procedure:             Colonoscopy ?Indications:           Screening for colorectal malignant neoplasm ?Providers:             [name] MD, MD ?Referring MD:          Juline Patch, MD (Referring MD) ?Medicines:             Propofol per Anesthesia ?Complications:         No immediate complications. ?Procedure:             Pre-Anesthesia Assessment: ?                       - Prior to the procedure, a History and Physical was  ?                       performed, and patient medications and allergies were  ?                       reviewed. The patient's tolerance of previous  ?                       anesthesia was also reviewed. The risks and benefits  ?                       of the procedure and the sedation options and risks  ?                       were discussed with the patient. All questions were  ?                       answered, and informed consent was obtained. Prior  ?                       Anticoagulants: The patient has taken no previous  ?                       anticoagulant or antiplatelet agents. ASA Grade  ?                       Assessment: II - A patient with mild systemic disease.  ?                       After reviewing the risks and benefits, the patient  ?                       was deemed in satisfactory condition to undergo the  ?                       procedure. ?                       After obtaining informed consent, the colonoscope was  ?  passed under direct vision. Throughout the procedure,  ?                       the patient's blood pressure, pulse, and oxygen  ?                       saturations were monitored continuously. The  ?                       Colonoscope  was introduced through the anus and  ?                       advanced to the the cecum, identified by appendiceal  ?                       orifice and ileocecal valve. The colonoscopy was  ?                       performed without difficulty. The patient tolerated  ?                       the procedure well. The quality of the bowel  ?                       preparation was excellent. ?Findings: ?     The perianal and digital rectal examinations were normal. ?     Multiple small-mouthed diverticula were found in the entire colon. ?     Non-bleeding internal hemorrhoids were found during retroflexion. The  ?     hemorrhoids were Grade I (internal hemorrhoids that do not prolapse). ?Impression:            - Diverticulosis in the entire examined colon. ?                       - Non-bleeding internal hemorrhoids. ?                       - No specimens collected. ?Recommendation:        - Discharge patient to home. ?                       - Resume previous diet. ?                       - Continue present medications. ?                       - Repeat colonoscopy in 10 years for screening  ?                       purposes. ?Procedure Code(s):     --- Professional --- ?                       [phone], Colonoscopy, flexible; diagnostic, including  ?                       collection of specimen(s) by brushing or washing, when  ?                       performed (separate procedure) ?  Diagnosis Code(s):     --- Professional --- ?                       Z12.11, Encounter for screening for malignant neoplasm  ?                       of colon ?CPT copyright 2019 [employer]. All rights reserved. ?The codes documented in this report are preliminary and upon coder review may  ?be revised to meet current compliance requirements. ?[name] MD, MD ?01/26/2022 8:23:52 AM ?This report has been signed electronically. ?Number of Addenda: 0 ?Note Initiated On: 01/26/2022 8:02 AM ?Scope Withdrawal Time: 0 hours 6 minutes 35  seconds  ?Total Procedure Duration: 0 hours 8 minutes 48 seconds  ?Estimated Blood Loss:  Estimated blood loss: none. ?     [facility] ?

## 2022-01-26 NOTE — Anesthesia Preprocedure Evaluation (Signed)
Anesthesia Evaluation  ?Patient identified by MRN, date of birth, ID band ?Patient awake ? ? ? ?Reviewed: ?Allergy & Precautions, H&P , NPO status , Patient's Chart, lab work & pertinent test results, reviewed documented beta blocker date and time  ? ?Airway ?Mallampati: II ? ?TM Distance: >3 FB ?Neck ROM: full ? ? ? Dental ?no notable dental hx. ? ?  ?Pulmonary ?former smoker,  ?  ?Pulmonary exam normal ?breath sounds clear to auscultation ? ? ? ? ? ? Cardiovascular ?Exercise Tolerance: Good ?hypertension,  ?Rhythm:regular Rate:Normal ? ? ?  ?Neuro/Psych ?negative neurological ROS ? negative psych ROS  ? GI/Hepatic ?negative GI ROS, Neg liver ROS,   ?Endo/Other  ?negative endocrine ROS ? Renal/GU ?negative Renal ROS  ?negative genitourinary ?  ?Musculoskeletal ? ? Abdominal ?  ?Peds ? Hematology ?negative hematology ROS ?(+)   ?Anesthesia Other Findings ? ? Reproductive/Obstetrics ?negative OB ROS ? ?  ? ? ? ? ? ? ? ? ? ? ? ? ? ?  ?  ? ? ? ? ? ? ? ? ?Anesthesia Physical ?Anesthesia Plan ? ?ASA: 2 ? ?Anesthesia Plan: General  ? ?Post-op Pain Management:   ? ?Induction:  ? ?PONV Risk Score and Plan: 3 and Propofol infusion and Treatment may vary due to age or medical condition ? ?Airway Management Planned:  ? ?Additional Equipment:  ? ?Intra-op Plan:  ? ?Post-operative Plan:  ? ?Informed Consent: I have reviewed the patients History and Physical, chart, labs and discussed the procedure including the risks, benefits and alternatives for the proposed anesthesia with the patient or authorized representative who has indicated his/her understanding and acceptance.  ? ? ? ?Dental Advisory Given ? ?Plan Discussed with: CRNA ? ?Anesthesia Plan Comments:   ? ? ? ? ? ? ?Anesthesia Quick Evaluation ? ?

## 2022-02-01 ENCOUNTER — Ambulatory Visit: Payer: BC Managed Care – PPO | Admitting: Family Medicine

## 2022-02-02 ENCOUNTER — Encounter: Payer: Self-pay | Admitting: Family Medicine

## 2022-02-02 ENCOUNTER — Ambulatory Visit: Payer: BC Managed Care – PPO | Admitting: Surgery

## 2022-02-02 ENCOUNTER — Ambulatory Visit (INDEPENDENT_AMBULATORY_CARE_PROVIDER_SITE_OTHER): Payer: BC Managed Care – PPO | Admitting: Family Medicine

## 2022-02-02 VITALS — BP 132/86 | HR 76 | Ht 65.0 in | Wt 194.0 lb

## 2022-02-02 DIAGNOSIS — B070 Plantar wart: Secondary | ICD-10-CM

## 2022-02-02 DIAGNOSIS — R030 Elevated blood-pressure reading, without diagnosis of hypertension: Secondary | ICD-10-CM

## 2022-02-02 MED ORDER — HYDROCHLOROTHIAZIDE 12.5 MG PO TABS: 12.5000 mg | ORAL_TABLET | Freq: Every day | ORAL | 1 refills | Status: DC

## 2022-02-02 NOTE — Progress Notes (Signed)
Date:  02/02/2022   Name:  Angela French   DOB:  Jul 05, 1971   MRN:  [id number]   Chief Complaint: Hypertension  Hypertension This is a chronic problem. The current episode started in the past 7 days. The problem has been gradually improving since onset. The problem is uncontrolled. Pertinent negatives include no chest pain, headaches, malaise/fatigue, neck pain, palpitations, peripheral edema, PND or shortness of breath. Risk factors for coronary artery disease include dyslipidemia. Past treatments include diuretics. The current treatment provides moderate improvement. There are no compliance problems.  There is no history of angina, kidney disease, CAD/MI, CVA, heart failure, left ventricular hypertrophy, PVD or retinopathy. There is no history of chronic renal disease, a hypertension causing med or renovascular disease.   Lab Results  Component Value Date   NA 140 01/04/2022   K 4.4 01/04/2022   CO2 21 01/04/2022   GLUCOSE 82 01/04/2022   BUN 5 (L) 01/04/2022   CREATININE 0.82 01/04/2022   CALCIUM 9.3 01/04/2022   EGFR 88 01/04/2022   GFRNONAA 87 05/12/2020   Lab Results  Component Value Date   CHOL 106 01/04/2022   HDL 54 01/04/2022   LDLCALC 34 01/04/2022   TRIG 95 01/04/2022   CHOLHDL 1.9 06/24/2019   No results found for: TSH No results found for: HGBA1C No results found for: WBC, HGB, HCT, MCV, PLT Lab Results  Component Value Date   ALT 15 02/07/2017   AST 20 02/07/2017   ALKPHOS 65 02/07/2017   BILITOT 0.5 02/07/2017   No results found for: 25OHVITD2, 25OHVITD3, VD25OH   Review of Systems  Constitutional:  Negative for chills, fever and malaise/fatigue.  HENT:  Negative for drooling, ear discharge, ear pain and sore throat.   Respiratory:  Negative for cough, shortness of breath and wheezing.   Cardiovascular:  Negative for chest pain, palpitations, leg swelling and PND.  Gastrointestinal:  Negative for abdominal pain, blood in stool, constipation,  diarrhea and nausea.  Endocrine: Negative for polydipsia.  Genitourinary:  Negative for dysuria, frequency, hematuria and urgency.  Musculoskeletal:  Negative for back pain, myalgias and neck pain.  Skin:  Negative for rash.  Allergic/Immunologic: Negative for environmental allergies.  Neurological:  Negative for dizziness and headaches.  Hematological:  Does not bruise/bleed easily.  Psychiatric/Behavioral:  Negative for suicidal ideas. The patient is not nervous/anxious.    Patient Active Problem List   Diagnosis Date Noted   Encounter for screening colonoscopy    Obesity 07/26/2017   Status post arthroscopy of left knee 07/26/2017   Primary osteoarthritis of left knee 03/29/2017    No Known Allergies  Past Surgical History:  Procedure Laterality Date   BREAST BIOPSY Left 04/22/2018   US Aspiration, US biopsy and Lymph node biopsy   BREAST CYST EXCISION Left    neg   COLONOSCOPY WITH PROPOFOL N/A 01/26/2022   Procedure: COLONOSCOPY WITH PROPOFOL;  Surgeon: [name], MD;  Location: [facility];  Service: Endoscopy;  Laterality: N/A;   FOOT SURGERY     KNEE ARTHROSCOPY Left 04/12/2017   Procedure: ARTHROSCOPY KNEE with chonadroplasty with partial menisectomy;  Surgeon: Leanor Kail, MD;  Location: [facility];  Service: Orthopedics;  Laterality: Left;    Social History   Tobacco Use   Smoking status: Former    Types: Cigarettes    Quit date: 01/31/2010    Years since quitting: 12.0   Smokeless tobacco: Never  Vaping Use   Vaping Use: Never used  Substance  Use Topics   Alcohol use: Yes    Alcohol/week: 7.0 standard drinks    Types: 7 Shots of liquor per week   Drug use: No     Medication list has been reviewed and updated.  Current Meds  Medication Sig   acetaminophen (TYLENOL) 500 MG tablet Take 1,000 mg by mouth daily.   hydrochlorothiazide (HYDRODIURIL) 12.5 MG tablet Take 1 tablet (12.5 mg total) by mouth daily.   [DISCONTINUED]  doxycycline (VIBRA-TABS) 100 MG tablet Take 1 tablet (100 mg total) by mouth 2 (two) times daily.   [DISCONTINUED] OVER THE COUNTER MEDICATION daily. [place]       02/02/2022    2:54 PM 01/04/2022    9:20 AM 05/12/2020   10:15 AM  GAD 7 : Generalized Anxiety Score  Nervous, Anxious, on Edge 0 0 0  Control/stop worrying 0 0 0  Worry too much - different things 0 0 0  Trouble relaxing 0 0 0  Restless 0 0 0  Easily annoyed or irritable 0 0 0  Afraid - awful might happen 0 0 1  Total GAD 7 Score 0 0 1  Anxiety Difficulty Not difficult at all Not difficult at all Not difficult at all       02/02/2022    2:54 PM  Depression screen PHQ 2/9  Decreased Interest 0  Down, Depressed, Hopeless 0  PHQ - 2 Score 0  Altered sleeping 0  Tired, decreased energy 0  Change in appetite 0  Feeling bad or failure about yourself  0  Trouble concentrating 0  Moving slowly or fidgety/restless 0  Suicidal thoughts 0  PHQ-9 Score 0  Difficult doing work/chores Not difficult at all    BP Readings from Last 3 Encounters:  02/02/22 (!) 130/92  01/26/22 (!) 136/99  01/04/22 (!) 138/94    Physical Exam Vitals and nursing note reviewed.  Constitutional:      Appearance: She is well-developed.  HENT:     Head: Normocephalic.     Right Ear: Tympanic membrane, ear canal and external ear normal.     Left Ear: Tympanic membrane, ear canal and external ear normal.     Nose: Nose normal.  Eyes:     General: Lids are everted, no foreign bodies appreciated. No scleral icterus.       Left eye: No foreign body or hordeolum.     Conjunctiva/sclera: Conjunctivae normal.     Right eye: Right conjunctiva is not injected.     Left eye: Left conjunctiva is not injected.     Pupils: Pupils are equal, round, and reactive to light.  Neck:     Thyroid: No thyromegaly.     Vascular: No JVD.     Trachea: No tracheal deviation.  Cardiovascular:     Rate and Rhythm: Normal rate and regular rhythm.     Heart  sounds: Normal heart sounds. No murmur heard.   No friction rub. No gallop.  Pulmonary:     Effort: Pulmonary effort is normal. No respiratory distress.     Breath sounds: Normal breath sounds. No wheezing or rales.  Abdominal:     General: Bowel sounds are normal.     Palpations: Abdomen is soft. There is no mass.     Tenderness: There is no abdominal tenderness. There is no guarding or rebound.  Musculoskeletal:        General: No tenderness.     Cervical back: Normal range of motion and neck supple.  Feet:  Right foot:     Skin integrity: Callus present.     Left foot:     Skin integrity: Callus present.     Comments: Plantar calluses c/w plantar wart Lymphadenopathy:     Cervical: No cervical adenopathy.  Skin:    Findings: No rash.  Neurological:     Mental Status: She is alert.     Cranial Nerves: No cranial nerve deficit.     Deep Tendon Reflexes: Reflexes normal.  Psychiatric:        Mood and Affect: Mood is not anxious or depressed.    Wt Readings from Last 3 Encounters:  02/02/22 194 lb (88 kg)  01/26/22 194 lb 12.8 oz (88.4 kg)  01/04/22 200 lb (90.7 kg)    BP (!) 130/92   Pulse 76   Ht _0  (1.651 m)   Wt 194 lb (88 kg)   LMP 01/21/2022 (Exact Date) Comment: preg test neg  BMI 32.28 kg/m   Assessment and Plan:  1. Elevated blood pressure reading in office without diagnosis of hypertension Chronic.  Controlled.  Stable.  Blood pressure today 132/86.  Continue hydrochlorothiazide 12.5 mg once a day. - hydrochlorothiazide (HYDRODIURIL) 12.5 MG tablet; Take 1 tablet (12.5 mg total) by mouth daily.  Dispense: 90 tablet; Refill: 1  2. Plantar warts Chronic.  Newly brought to our attention.  Uncontrolled and with pain on ambulation.  Multiple hyperkeratosis secondary to ice presume plantar warts on the soles of the feet.  We will refer to podiatry for evaluation and treatment. - Ambulatory referral to Podiatry

## 2022-02-09 ENCOUNTER — Ambulatory Visit
Admission: RE | Admit: 2022-02-09 | Discharge: 2022-02-09 | Disposition: A | Discharge status: Home / Self Care | Payer: BC Managed Care – PPO | Source: Ambulatory Visit | Attending: Family Medicine | Admitting: Family Medicine

## 2022-02-09 ENCOUNTER — Telehealth: Payer: Self-pay

## 2022-02-09 ENCOUNTER — Telehealth: Payer: Self-pay | Admitting: Family Medicine

## 2022-02-09 ENCOUNTER — Other Ambulatory Visit: Payer: Self-pay

## 2022-02-09 DIAGNOSIS — N6459 Other signs and symptoms in breast: Secondary | ICD-10-CM | POA: Diagnosis not present

## 2022-02-09 DIAGNOSIS — R922 Inconclusive mammogram: Secondary | ICD-10-CM | POA: Diagnosis not present

## 2022-02-09 DIAGNOSIS — N61 Mastitis without abscess: Secondary | ICD-10-CM

## 2022-02-09 DIAGNOSIS — N6452 Nipple discharge: Secondary | ICD-10-CM | POA: Diagnosis not present

## 2022-02-09 MED ORDER — AMOXICILLIN-POT CLAVULANATE 875-125 MG PO TABS: 1.0000 | ORAL_TABLET | Freq: Two times a day (BID) | ORAL | 0 refills | Status: DC

## 2022-02-09 NOTE — Progress Notes (Signed)
Sent in Augmentin.

## 2022-02-09 NOTE — Telephone Encounter (Signed)
Copied from [place] [id number] [id number]. Topic: General - Call Back - No Documentation >> Feb 09, 2022 11:56 AM Erick Blinks wrote: Olivia Mackie from [facility], needs to speak to clinic regarding imaging recommendations. Please call back   Best contact: [phone]

## 2022-02-09 NOTE — Telephone Encounter (Signed)
Called and left a message for Angela French that we will be starting med/ Augmentin on pt today. We will see her Tuesday and I tried to call [facility] and they close at 100 today. Will try to call them again on Tuesday

## 2022-02-13 ENCOUNTER — Other Ambulatory Visit: Payer: Self-pay | Admitting: Family Medicine

## 2022-02-13 ENCOUNTER — Ambulatory Visit: Payer: BC Managed Care – PPO | Admitting: Family Medicine

## 2022-02-13 DIAGNOSIS — N61 Mastitis without abscess: Secondary | ICD-10-CM

## 2022-02-13 DIAGNOSIS — R928 Other abnormal and inconclusive findings on diagnostic imaging of breast: Secondary | ICD-10-CM

## 2022-02-13 DIAGNOSIS — N611 Abscess of the breast and nipple: Secondary | ICD-10-CM

## 2022-02-14 ENCOUNTER — Encounter: Payer: Self-pay | Admitting: Surgery

## 2022-02-14 ENCOUNTER — Other Ambulatory Visit: Payer: Self-pay

## 2022-02-14 ENCOUNTER — Ambulatory Visit (INDEPENDENT_AMBULATORY_CARE_PROVIDER_SITE_OTHER): Payer: BC Managed Care – PPO | Admitting: Surgery

## 2022-02-14 ENCOUNTER — Telehealth: Payer: Self-pay | Admitting: Surgery

## 2022-02-14 VITALS — BP 126/91 | HR 83 | Temp 98.4°F | Ht 65.0 in | Wt 197.4 lb

## 2022-02-14 DIAGNOSIS — N611 Abscess of the breast and nipple: Secondary | ICD-10-CM | POA: Diagnosis not present

## 2022-02-14 NOTE — Telephone Encounter (Signed)
Left message for patient to call, please inform her of the following regarding scheduled surgery:   Pre-Admission date/time, COVID Testing date and Surgery date.  Surgery Date: 02/28/22 Preadmission Testing Date: 02/22/22 (phone 8a-1p) Covid Testing Date: Not needed.     Also patient will need to call at [phone], between 1-3:00pm the day before surgery, to find out what time to arrive for surgery.

## 2022-02-14 NOTE — Progress Notes (Signed)
02/14/2022  Reason for Visit:  Recurrent left breast abscess  Requesting Provider:  Otilio Miu, MD  History of Present Illness: Angela French is a 51 y.o. female presenting for evaluation of a left breast abscess.  The patient reports a long history of at least 5 years of intermittent issues with the left breast.  She reports having an area around the areola on the left breast that will intermittently get swollen and tender, with spontaneous drainage.  This will then improve and subsequently redevelop.  She has had a prior I&D procedure.  More recently she saw Dr. Ronnald Ramp and had a course of Doxycycline, which the patient reports helped, but not significantly.  The area drained spontaneously and redeveloped last week.  She was started on a course of Augmentin on 02/11/22 and reports that this has been helping well and there are is much improved with less tenderness or swelling.  It had been draining purulent fluid, but that stopped with the antibiotic.  Denies any fevers, chills, chest pain, shortness of breath.  Of note, the patient has had a prior biopsy in the same area of the left breast in 2019 which showed inflammatory changes and fibrosis.  She had a bilateral diagnostic mammogram and left ultrasound on 02/09/22 which showed left breast mastitis and possible retroareolar abscess.  Past Medical History: Past Medical History:  Diagnosis Date   Arthritis    knee   Hypertension    Wears contact lenses      Past Surgical History: Past Surgical History:  Procedure Laterality Date   BREAST BIOPSY Left 04/22/2018   US Aspiration, US biopsy and Lymph node biopsy   BREAST CYST EXCISION Left    neg   COLONOSCOPY WITH PROPOFOL N/A 01/26/2022   Procedure: COLONOSCOPY WITH PROPOFOL;  Surgeon: [name], MD;  Location: [place];  Service: Endoscopy;  Laterality: N/A;   FOOT SURGERY     KNEE ARTHROSCOPY Left 04/12/2017   Procedure: ARTHROSCOPY KNEE with chonadroplasty with partial  menisectomy;  Surgeon: Leanor Kail, MD;  Location: [place];  Service: Orthopedics;  Laterality: Left;    Home Medications: Prior to Admission medications   Medication Sig Start Date End Date Taking? Authorizing Provider  acetaminophen (TYLENOL) 500 MG tablet Take 1,000 mg by mouth daily.   Yes [provider]  amoxicillin-clavulanate (AUGMENTIN) 875-125 MG tablet Take 1 tablet by mouth 2 (two) times daily. 02/09/22  Yes Juline Patch, MD  hydrochlorothiazide (HYDRODIURIL) 12.5 MG tablet Take 1 tablet (12.5 mg total) by mouth daily. 02/02/22  Yes Juline Patch, MD    Allergies: No Known Allergies  Social History:  reports that she quit smoking about 12 years ago. Her smoking use included cigarettes. She has never used smokeless tobacco. She reports current alcohol use of about 7.0 standard drinks per week. She reports that she does not use drugs.   Family History: Family History  Problem Relation Age of Onset   Diabetes Mother    Hypertension Father    Hypertension Sister    Breast cancer Sister 56   Breast cancer Paternal Grandmother     Review of Systems: Review of Systems  Constitutional:  Negative for chills and fever.  HENT:  Negative for hearing loss.   Respiratory:  Negative for shortness of breath.   Cardiovascular:  Negative for chest pain.  Gastrointestinal:  Negative for abdominal pain, nausea and vomiting.  Genitourinary:  Negative for dysuria.  Musculoskeletal:  Negative for myalgias.  Skin:  Left breast abscess with drainage, swelling, tenderness  Neurological:  Negative for dizziness.  Psychiatric/Behavioral:  Negative for depression.    Physical Exam BP (!) 126/91   Pulse 83   Temp 98.4 F (36.9 C) (Oral)   Ht '5\' 5"'$  (1.651 m)   Wt 197 lb 6.4 oz (89.5 kg)   LMP 01/21/2022 (Exact Date) Comment: preg test neg  SpO2 100%   BMI 32.85 kg/m  CONSTITUTIONAL: No acute distress, well nourished. HEENT:  Normocephalic,  atraumatic, extraocular motion intact. NECK: Trachea is midline, and there is no jugular venous distension.  RESPIRATORY:  Lungs are clear, and breath sounds are equal bilaterally. Normal respiratory effort without pathologic use of accessory muscles. CARDIOVASCULAR: Heart is regular without murmurs, gallops, or rubs. BREAST:  Left breast with evidence of recent infection, with some resolving induration/swelling in the superior portion of the areola.  The patient has a small wound about 2 mm size, at the 12 o'clock position just outside the areola consistent with area of prior drainage.  No significant pain currently, no active drainage or significant erythema. MUSCULOSKELETAL:  Normal muscle strength and tone in all four extremities.  No peripheral edema or cyanosis. SKIN: Skin turgor is normal. There are no pathologic skin lesions.  NEUROLOGIC:  Motor and sensation is grossly normal.  Cranial nerves are grossly intact. PSYCH:  Alert and oriented to person, place and time. Affect is normal.  Laboratory Analysis: Labs from 01/04/22: Na 140, K 4.4, Cl 103, CO2 21, BUN 5, Cr 0.82, albumin 4.2  Imaging: Mammogram/US 02/09/22: FINDINGS: Within the retroareolar left breast there is a ribbon shaped marking clip. When compared to prior exam there has been interval increase in retroareolar parenchymal and trabecular thickening as well as overlying skin thickening. No additional masses, calcifications or distortion identified within either breast.   On physical exam, there is a small raised mass within the superior Peri areolar left breast. Additionally there is a draining area along the superomedial periareolar left breast.   Targeted ultrasound is performed, showing a 1.9 cm hypoechoic mass within the immediate retroareolar left breast containing biopsy marking clip, compatible with the previously biopsied area of chronic mastitis. This does not appear to be drainable with percutaneous needle at this  time. No large fluid collection identified. Overlying skin thickening.   IMPRESSION: Findings most compatible with left breast mastitis and possible retroareolar abscess.   RECOMMENDATION: Patient needs 2 weeks of antibiotic therapy.   Follow-up left breast diagnostic mammogram and ultrasound in 2 weeks near the completion of the time of antibiotic therapy to assess for interval improvement.   If the findings are not amenable to percutaneous drainage, and do not significantly improve with antibiotic therapy, surgical consultation for I and D is warranted.   I have discussed the findings and recommendations with the patient. If applicable, a reminder letter will be sent to the patient regarding the next appointment.   BI-RADS CATEGORY  3: Probably benign.  Assessment and Plan: This is a 51 y.o. female with a recurrent left breast abscess.  --The patient has been dealing with a recurrent left breast abscess for a few years now and has had prior I&D done.  She currently is improving with course of Augmentin, but the patient would like a more definitive treatment for this. --Discussed with her that we can offer an excision of the abscess cavity but before that, would want to allow the left breast abscess to heal better with the current antibiotic course, with the hopes  that on our operative excision, the skin edges can be closed rather than leaving the wound open for packing.  She understands and is in agreement.  --Tentatively, will schedule for the patient for excision on 02/28/22.  Reviewed the surgery at length with her including risks of bleeding, infection, injury to surrounding structures, need for further procedures, that this is an outpatient surgery, post-op activity restrictions, pain control, use of breast binder, and she's in agreement to proceed. --Patient will follow up with me on 02/26/22 to check on the abscess healing to make sure everything is ok for surgery on 02/28/22.  I spent  80 minutes dedicated to the care of this patient on the date of this encounter to include pre-visit review of records, face-to-face time with the patient discussing diagnosis and management, and any post-visit coordination of care.   Melvyn Neth, [facility]

## 2022-02-14 NOTE — Patient Instructions (Addendum)
Try warm compresses. Continue Antibiotic until gone. Please see your follow up appointment listed below.   Mastitis  Mastitis is inflammation of the breast tissue. It occurs most often in women who are breastfeeding, but it can also affect other women, and sometimes even men. Mastitis will sometimes go away on its own. A health care provider will help determine if medical treatment is needed. What are the causes? This condition is usually caused by a bacterial infection. Bacteria can enter the breast tissue through cuts, cracks, or openings in the skin. This usually occurs with breastfeeding because of cracked or irritated nipples. Sometimes, mastitis can occur when there are no cuts or openings in the skin. This is usually caused by plugged milk ducts. Plugged milk ducts block the flow of milk in the breast. Other causes include: Nipple piercing. Some forms of breast cancer. What are the signs or symptoms? Symptoms of this condition include: Swelling, redness, tenderness, and pain in an area of the breast. The area may also feel warm to the touch. These symptoms usually affect the upper part of the breast, toward the armpit region. Swelling of the glands under the arm on the same side. Discharge from the nipple. Fatigue, headache, and flu-like muscle aches. Fever and chills. Nausea and vomiting. Rapid pulse. Symptoms usually last 2-5 days. Breast pain and redness are at their worst on days 2 and 3, and they usually go away by day 5. If an infection is left to worsen, a collection of pus, or an abscess, may develop. How is this diagnosed? This condition can usually be diagnosed based on a physical exam and your symptoms. You may also have other tests, such as: Blood tests to check if your body is fighting an infection from bacteria. Mammogram or ultrasound tests to rule out other problems or diseases. Fluid tests. If an abscess has developed, the fluid in the abscess may be removed with a  needle. The fluid may be checked to see whether bacteria are present. Breast milk testing. A sample of breast milk may be tested for bacteria. This is done only when breastfeeding or pumping. How is this treated? Mastitis that occurs with breastfeeding will sometimes go away on its own, so your health care provider may choose to wait 24 hours after first seeing you to decide whether a prescription medicine is needed. If treatment is needed, it may include: Continuing to breastfeed or pump from both breasts to allow adequate milk flow and prevent an abscess from forming. Applying hot or cold compresses to the affected area. Medicine for pain. Antibiotic medicine to treat a bacterial infection. Self-care, such as rest and drinking more fluids. Using a needle to remove fluid from an abscess if one has developed. Follow these instructions at home: Breast care  Keep your nipples clean and dry. If directed, apply heat to the affected area of your breast. Use the heat source that your health care provider or lactation specialist recommends. If directed, place ice on the affected area of your breast. To do this: Put ice in a plastic bag. Place a towel between your skin and the bag. Leave the ice on for 20 minutes, 2-3 times a day. Remove the ice if your skin turns bright red. This is very important. If you cannot feel pain, heat, or cold, you have a greater risk of damage to the area. Breastfeeding and pumping tips Continue to breastfeed your baby on demand. This means feeding your baby whenever he or she is hungry.  Ask your health care provider or lactation specialist whether you need to change your breastfeeding or pumping routine. Avoid using nipple shields for feedings, if possible. Ask a lactation specialist for assistance if needed. Alternate the breast you offer first at each feeding to make sure your baby feeds from both breasts equally. Offer both breasts to your baby at every feeding. Use  gentle breast massage during feeding or pumping sessions only as told by your health care provider or lactation specialist. Avoid allowing your breasts to become very full with milk (engorged). If your breasts are engorged, you can hand express a small amount of milk for comfort. If you are pumping, continue to pump on the same schedule as you were before. In the breast with mastitis, pump until very little milk is coming out. Do not empty your breast. Emptying your breast causes your body to make more milk and can make symptoms worse. Ask your health care provider or lactation specialist whether you need to change your pumping routine. Medicines Take over-the-counter and prescription medicines as told by your health care provider. If you were prescribed an antibiotic medicine, take it as told by your health care provider. Do not stop using the antibiotic even if you start to feel better. Contact a health care provider if: You have pus-like discharge from the breast. You have a fever. Your symptoms do not improve within 2 days of starting treatment. Get help right away if: Your pain and swelling are getting worse. You have pain that is not controlled with medicine. You have a red line extending from the breast toward your armpit. Summary Mastitis is inflammation of the breast tissue. It occurs most often in women who are breastfeeding, but it can also affect non-breastfeeding women and some men. This condition is usually caused by a bacterial infection. This condition may be treated with hot and cold compresses, medicines, self-care, and breastfeeding. If you were prescribed an antibiotic medicine, take it as told by your health care provider. Do not stop using the antibiotic even if you start to feel better. This information is not intended to replace advice given to you by your health care provider. Make sure you discuss any questions you have with your health care provider. Document Revised:  10/06/2021 Document Reviewed: 07/04/2020 Elsevier Patient Education  [employer].

## 2022-02-15 DIAGNOSIS — N611 Abscess of the breast and nipple: Secondary | ICD-10-CM

## 2022-02-15 DIAGNOSIS — M2041 Other hammer toe(s) (acquired), right foot: Secondary | ICD-10-CM

## 2022-02-15 HISTORY — DX: Other hammer toe(s) (acquired), right foot: M20.41

## 2022-02-15 HISTORY — DX: Abscess of the breast and nipple: N61.1

## 2022-02-19 NOTE — Telephone Encounter (Signed)
Left another message for patient to call so that we can provide information to her regarding her surgery.

## 2022-02-19 NOTE — Telephone Encounter (Signed)
Patient calls back, she is now aware of all dates regarding her surgery and verbalized understanding.

## 2022-02-22 ENCOUNTER — Inpatient Hospital Stay
Admission: RE | Admit: 2022-02-22 | Discharge: 2022-02-22 | Disposition: A | Discharge status: Home / Self Care | Payer: BC Managed Care – PPO | Source: Ambulatory Visit

## 2022-02-22 NOTE — Patient Instructions (Addendum)
Your procedure is scheduled on: Wednesday 02/28/22 Report to the Registration Desk on the 1st floor of the [facility]. To find out your arrival time, please call [phone] between 1PM - 3PM on: Tuesday 02/27/22 If your arrival time is 6:00 am, do not arrive prior to that time as the [place] entrance doors do not open until 6:00 am.  REMEMBER: Instructions that are not followed completely may result in serious medical risk, up to and including death; or upon the discretion of your surgeon and anesthesiologist your surgery may need to be rescheduled.  Do not eat food after midnight the night before surgery.  No gum chewing, lozengers or hard candies.  You may however, drink CLEAR liquids up to 2 hours before you are scheduled to arrive for your surgery. Do not drink anything within 2 hours of your scheduled arrival time.  Clear liquids include: - water  - apple juice without pulp - gatorade (not RED colors) - black coffee or tea (Do NOT add milk or creamers to the coffee or tea) Do NOT drink anything that is not on this list.  TAKE THESE MEDICATIONS THE MORNING OF SURGERY WITH A SIP OF WATER: NONE  One week prior to surgery: Stop Anti-inflammatories (NSAIDS) such as Advil, Aleve, Ibuprofen, Motrin, Naproxen, Naprosyn and Aspirin based products such as Excedrin, Goodys Powder, BC Powder.  Stop ANY OVER THE COUNTER supplements until after surgery.  You may however, continue to take Tylenol 500 mg if needed for pain up until the day of surgery.  No Alcohol for 24 hours before or after surgery.  No Smoking including e-cigarettes for 24 hours prior to surgery.  No chewable tobacco products for at least 6 hours prior to surgery.  No nicotine patches on the day of surgery.  Do not use any "recreational" drugs for at least a week prior to your surgery.  Please be advised that the combination of cocaine and anesthesia may have negative outcomes, up to and including death. If you  test positive for cocaine, your surgery will be cancelled.  On the morning of surgery brush your teeth with toothpaste and water, you may rinse your mouth with mouthwash if you wish. Do not swallow any toothpaste or mouthwash.  Use CHG  wipes as directed on instruction sheet.  Do not wear jewelry, make-up, hairpins, clips or nail polish.  Do not wear lotions, powders, or perfumes.   Do not shave body from the neck down 48 hours prior to surgery just in case you cut yourself which could leave a site for infection.  Also, freshly shaved skin may become irritated if using the CHG soap.  Contact lenses, hearing aids and dentures may not be worn into surgery.  Do not bring valuables to the hospital. [facility] is not responsible for any missing/lost belongings or valuables.   Bring your C-PAP to the hospital with you in case you may have to spend the night.   Notify your doctor if there is any change in your medical condition (cold, fever, infection).  Wear comfortable clothing (specific to your surgery type) to the hospital.  After surgery, you can help prevent lung complications by doing breathing exercises.  Take deep breaths and cough every 1-2 hours.   If you are being discharged the day of surgery, you will not be allowed to drive home. You will need a responsible adult (18 years or older) to drive you home and stay with you that night.   If you  are taking public transportation, you will need to have a responsible adult (18 years or older) with you. Please confirm with your physician that it is acceptable to use public transportation.   Please call the [facility] at [phone] if you have any questions about these instructions.  Surgery Visitation Policy:  Patients undergoing a surgery or procedure may have two family members or support persons with them as long as the person is not COVID-19 positive or experiencing its symptoms.   Inpatient Visitation:     Visiting hours are 7 a.m. to 8 p.m. Up to four visitors are allowed at one time in a patient room, including children. The visitors may rotate out with other people during the day. One designated support person (adult) may remain overnight.

## 2022-02-26 ENCOUNTER — Ambulatory Visit (INDEPENDENT_AMBULATORY_CARE_PROVIDER_SITE_OTHER): Payer: BC Managed Care – PPO | Admitting: Surgery

## 2022-02-26 ENCOUNTER — Encounter: Payer: Self-pay | Admitting: Surgery

## 2022-02-26 ENCOUNTER — Ambulatory Visit: Payer: BC Managed Care – PPO | Admitting: Surgery

## 2022-02-26 VITALS — BP 132/90 | HR 75 | Temp 97.6°F | Wt 196.4 lb

## 2022-02-26 DIAGNOSIS — M2041 Other hammer toe(s) (acquired), right foot: Secondary | ICD-10-CM | POA: Diagnosis not present

## 2022-02-26 DIAGNOSIS — N611 Abscess of the breast and nipple: Secondary | ICD-10-CM | POA: Diagnosis not present

## 2022-02-26 DIAGNOSIS — D2371 Other benign neoplasm of skin of right lower limb, including hip: Secondary | ICD-10-CM | POA: Diagnosis not present

## 2022-02-26 DIAGNOSIS — M79671 Pain in right foot: Secondary | ICD-10-CM | POA: Diagnosis not present

## 2022-02-26 DIAGNOSIS — M79672 Pain in left foot: Secondary | ICD-10-CM | POA: Diagnosis not present

## 2022-02-26 NOTE — H&P (View-Only) (Signed)
02/26/2022  History of Present Illness: Angela French is a 51 y.o. female with history of recurrent left breast abscess.  She's had prior I&D in the past.  She was treated recently with a course of Doxycycline followed by a course of Augmentin last month.  When I saw her on 02/14/22, she was doing better with the Augmentin and we scheduled her tentatively for excision of the abscess cavity on 02/28/22.  She presents today for follow up to make sure everything is healing well in preparation for surgery.  She reports that she completed the antibiotic course and denies any further pain in the left breast.  However, she still notices some drainage of fluid from a small wound superior to the areola.  She describes it as green/clear fluid, unclear if it's purulent.  Past Medical History: Past Medical History:  Diagnosis Date   Arthritis    knee   Hypertension    Wears contact lenses      Past Surgical History: Past Surgical History:  Procedure Laterality Date   BREAST BIOPSY Left 04/22/2018   US Aspiration, US biopsy and Lymph node biopsy   BREAST CYST EXCISION Left    neg   COLONOSCOPY WITH PROPOFOL N/A 01/26/2022   Procedure: COLONOSCOPY WITH PROPOFOL;  Surgeon: [name], MD;  Location: [facility];  Service: Endoscopy;  Laterality: N/A;   FOOT SURGERY     KNEE ARTHROSCOPY Left 04/12/2017   Procedure: ARTHROSCOPY KNEE with chonadroplasty with partial menisectomy;  Surgeon: Leanor Kail, MD;  Location: [facility];  Service: Orthopedics;  Laterality: Left;    Home Medications: Prior to Admission medications   Medication Sig Start Date End Date Taking? Authorizing Provider  acetaminophen (TYLENOL) 500 MG tablet Take 1,000 mg by mouth daily.    [provider]  amoxicillin-clavulanate (AUGMENTIN) 875-125 MG tablet Take 1 tablet by mouth 2 (two) times daily. 02/09/22   Juline Patch, MD  hydrochlorothiazide (HYDRODIURIL) 12.5 MG tablet Take 1 tablet (12.5  mg total) by mouth daily. 02/02/22   Juline Patch, MD    Allergies: No Known Allergies  Review of Systems: Review of Systems  Constitutional:  Negative for chills and fever.  Respiratory:  Negative for shortness of breath.   Cardiovascular:  Negative for chest pain.  Gastrointestinal:  Negative for abdominal pain, nausea and vomiting.  Skin:  Negative for rash.    Physical Exam BP 132/90   Pulse 75   Temp 97.6 F (36.4 C) (Oral)   Wt 196 lb 6.4 oz (89.1 kg)   SpO2 99%   BMI 32.68 kg/m  CONSTITUTIONAL: No acute distress, well nourished. HEENT:  Normocephalic, atraumatic, extraocular motion intact. RESPIRATORY:  Lungs are clear, and breath sounds are equal bilaterally. Normal respiratory effort without pathologic use of accessory muscles. CARDIOVASCULAR: Heart is regular without murmurs, gallops, or rubs. BREAST:  Left breast with small 2 mm wound at 12 o'clock adjacent to the areola.  No active drainage or fluid that can be expressed.  There is some firmness of the areola around this area, but no significant induration and no erythema or tenderness. NEUROLOGIC:  Motor and sensation is grossly normal.  Cranial nerves are grossly intact. PSYCH:  Alert and oriented to person, place and time. Affect is normal.  Labs/Imaging: Mammogram and U/S 02/09/22: FINDINGS: Within the retroareolar left breast there is a ribbon shaped marking clip. When compared to prior exam there has been interval increase in retroareolar parenchymal and trabecular thickening as well as  overlying skin thickening. No additional masses, calcifications or distortion identified within either breast.   On physical exam, there is a small raised mass within the superior Peri areolar left breast. Additionally there is a draining area along the superomedial periareolar left breast.   Targeted ultrasound is performed, showing a 1.9 cm hypoechoic mass within the immediate retroareolar left breast containing biopsy  marking clip, compatible with the previously biopsied area of chronic mastitis. This does not appear to be drainable with percutaneous needle at this time. No large fluid collection identified. Overlying skin thickening.   IMPRESSION: Findings most compatible with left breast mastitis and possible retroareolar abscess.   RECOMMENDATION: Patient needs 2 weeks of antibiotic therapy.   Follow-up left breast diagnostic mammogram and ultrasound in 2 weeks near the completion of the time of antibiotic therapy to assess for interval improvement.   If the findings are not amenable to percutaneous drainage, and do not significantly improve with antibiotic therapy, surgical consultation for I and D is warranted.   I have discussed the findings and recommendations with the patient. If applicable, a reminder letter will be sent to the patient regarding the next appointment.   BI-RADS CATEGORY  3: Probably benign.  Assessment and Plan: This is a 51 y.o. female with recurrent left breast abscess  --Discussed with the patient that given the persistent drainage, though less likely to be purulent, we may need to leave the wound open after excising the abscess cavity.  If left open, would then be doing packing dressing changes with iodoform gauze, either 1/4 or 1/2 inch.  All will depend on how the tissue looks during surgery and if there is any true purulence.  She's in agreement and understood the dressing instructions. --Reviewed the plan for surgery again with her and all of her questions have been answered.  She knows to keep her Preop testing appointment tomorrow morning.  I spent 15 minutes dedicated to the care of this patient on the date of this encounter to include pre-visit review of records, face-to-face time with the patient discussing diagnosis and management, and any post-visit coordination of care.   Melvyn Neth, [facility]

## 2022-02-26 NOTE — Patient Instructions (Signed)
If you have any concerns or questions, please feel free to call our office.   Mastitis  Mastitis is irritation and swelling (inflammation) in an area of the breast. This most often happens in women who are breastfeeding, but it can happen to other women too, as well as some men. A doctor will help decide if treatment is needed. What are the causes? This condition is caused by: Germs (bacteria). This can happen when germs enter the breast through cuts or openings in the skin. A plugged milk duct. This happens when something blocks the flow of milk in the breast. Nipple piercing. The pierced area can allow germs to enter the breast. Some types of breast cancer. What are the signs or symptoms? Swelling, redness, and pain in the breast. Swelling of the glands under the arm. Fluid flowing from the nipple. Feeling very tired. Headache and body aches. Fever and chills. Vomiting or feeling like you may vomit. Fast heart rate. Symptoms often last 2-5 days. The pain and redness can be the worst on days 2 and 3. This will often go away by day 5. If the infection is not treated, pus or a pocket of fluid may form under the skin (abscess). How is this diagnosed? This condition can usually be diagnosed based on a physical exam and your symptoms. You may also have other tests, such as: Blood tests to check if your body is fighting infection. X-rays or ultrasounds of the breast. Fluid tests. If a pocket of fluid has formed, the fluid may be taken out with a needle. The fluid may be checked to see whether germs are present. Breast milk testing. A sample of breast milk may be tested for germs. This is done only when breastfeeding or pumping. How is this treated? Mastitis will sometimes go away on its own, so your doctor may choose to wait 24 hours after first seeing you to decide if medicine is needed. This condition may be treated with: Continuing to breastfeed or pump from both breasts to allow milk flow  and prevent a pocket of fluid from forming. Using hot or cold compresses. Taking medicine for pain. Taking antibiotic medicine. Rest. Drinking plenty of fluids. Taking out fluid with a needle, if a pocket of fluid has formed. Follow these instructions at home: Breast care  Keep your nipples clean and dry. If told, put heat on the affected area of your breast. Use the heat source that your doctor or lactation specialist tells you to use. If told, put ice on the affected area of your breast. To do this: Put ice in a plastic bag. Place a towel between your skin and the bag. Leave the ice on for 20 minutes, 2-3 times a day. Take off the ice if your skin turns bright red. This is very important. If you cannot feel pain, heat, or cold, you have a greater risk of damage to the area. Breastfeeding and pumping tips Continue to breastfeed your baby on demand. This means feeding your baby whenever he or she is hungry. Ask your doctor or lactation specialist whether you need to change your breastfeeding routine. Avoid using nipple shields, if possible. Ask a lactation specialist for help. Change the breast you offer first at each feeding to make sure your baby feeds from both breasts. Offer both breasts to your baby every time your baby feeds. Use gentle breast massage during feeding or pumping sessions only as told by your doctor or lactation specialist. Avoid letting your breasts get  very full with milk (engorged). If your breasts are very full, you can hand express a small amount of milk for comfort. If you are pumping, keep pumping on the same schedule as you were before. In the breast with mastitis, pump until very little milk is coming out. Do not empty your breast. Emptying your breast causes your body to make more milk and can make symptoms worse. Ask your doctor or lactation specialist whether you need to change your pumping routine. Medicines Take over-the-counter and prescription medicines  only as told by your doctor. If you were prescribed an antibiotic medicine, take it as told by your doctor. Do not stop taking it even if you start to feel better. Contact a doctor if: You have pus-like fluid leaking from your breast. You have a fever. Your symptoms do not get better within 2 days of starting treatment. Get help right away if: Your pain and swelling are getting worse. Your pain is not helped by medicine. You have a red line going from your breast toward your armpit. Summary Mastitis is irritation and swelling in an area of the breast. Mastitis will sometimes go away on its own. Get plenty of rest. Contact a doctor if your symptoms do not get better within 2 days. If you were prescribed an antibiotic medicine, do not stop taking it even if you start to feel better. This information is not intended to replace advice given to you by your health care provider. Make sure you discuss any questions you have with your health care provider. Document Revised: 10/06/2021 Document Reviewed: 07/04/2020 Elsevier Patient Education  [place].

## 2022-02-26 NOTE — Progress Notes (Signed)
02/26/2022  History of Present Illness: Angela French is a 51 y.o. female with history of recurrent left breast abscess.  She's had prior I&D in the past.  She was treated recently with a course of Doxycycline followed by a course of Augmentin last month.  When I saw her on 02/14/22, she was doing better with the Augmentin and we scheduled her tentatively for excision of the abscess cavity on 02/28/22.  She presents today for follow up to make sure everything is healing well in preparation for surgery.  She reports that she completed the antibiotic course and denies any further pain in the left breast.  However, she still notices some drainage of fluid from a small wound superior to the areola.  She describes it as green/clear fluid, unclear if it's purulent.  Past Medical History: Past Medical History:  Diagnosis Date   Arthritis    knee   Hypertension    Wears contact lenses      Past Surgical History: Past Surgical History:  Procedure Laterality Date   BREAST BIOPSY Left 04/22/2018   US Aspiration, US biopsy and Lymph node biopsy   BREAST CYST EXCISION Left    neg   COLONOSCOPY WITH PROPOFOL N/A 01/26/2022   Procedure: COLONOSCOPY WITH PROPOFOL;  Surgeon: [name], MD;  Location: [facility];  Service: Endoscopy;  Laterality: N/A;   FOOT SURGERY     KNEE ARTHROSCOPY Left 04/12/2017   Procedure: ARTHROSCOPY KNEE with chonadroplasty with partial menisectomy;  Surgeon: Leanor Kail, MD;  Location: [facility];  Service: Orthopedics;  Laterality: Left;    Home Medications: Prior to Admission medications   Medication Sig Start Date End Date Taking? Authorizing Provider  acetaminophen (TYLENOL) 500 MG tablet Take 1,000 mg by mouth daily.    [provider]  amoxicillin-clavulanate (AUGMENTIN) 875-125 MG tablet Take 1 tablet by mouth 2 (two) times daily. 02/09/22   Juline Patch, MD  hydrochlorothiazide (HYDRODIURIL) 12.5 MG tablet Take 1 tablet (12.5  mg total) by mouth daily. 02/02/22   Juline Patch, MD    Allergies: No Known Allergies  Review of Systems: Review of Systems  Constitutional:  Negative for chills and fever.  Respiratory:  Negative for shortness of breath.   Cardiovascular:  Negative for chest pain.  Gastrointestinal:  Negative for abdominal pain, nausea and vomiting.  Skin:  Negative for rash.    Physical Exam BP 132/90   Pulse 75   Temp 97.6 F (36.4 C) (Oral)   Wt 196 lb 6.4 oz (89.1 kg)   SpO2 99%   BMI 32.68 kg/m  CONSTITUTIONAL: No acute distress, well nourished. HEENT:  Normocephalic, atraumatic, extraocular motion intact. RESPIRATORY:  Lungs are clear, and breath sounds are equal bilaterally. Normal respiratory effort without pathologic use of accessory muscles. CARDIOVASCULAR: Heart is regular without murmurs, gallops, or rubs. BREAST:  Left breast with small 2 mm wound at 12 o'clock adjacent to the areola.  No active drainage or fluid that can be expressed.  There is some firmness of the areola around this area, but no significant induration and no erythema or tenderness. NEUROLOGIC:  Motor and sensation is grossly normal.  Cranial nerves are grossly intact. PSYCH:  Alert and oriented to person, place and time. Affect is normal.  Labs/Imaging: Mammogram and U/S 02/09/22: FINDINGS: Within the retroareolar left breast there is a ribbon shaped marking clip. When compared to prior exam there has been interval increase in retroareolar parenchymal and trabecular thickening as well as  overlying skin thickening. No additional masses, calcifications or distortion identified within either breast.   On physical exam, there is a small raised mass within the superior Peri areolar left breast. Additionally there is a draining area along the superomedial periareolar left breast.   Targeted ultrasound is performed, showing a 1.9 cm hypoechoic mass within the immediate retroareolar left breast containing biopsy  marking clip, compatible with the previously biopsied area of chronic mastitis. This does not appear to be drainable with percutaneous needle at this time. No large fluid collection identified. Overlying skin thickening.   IMPRESSION: Findings most compatible with left breast mastitis and possible retroareolar abscess.   RECOMMENDATION: Patient needs 2 weeks of antibiotic therapy.   Follow-up left breast diagnostic mammogram and ultrasound in 2 weeks near the completion of the time of antibiotic therapy to assess for interval improvement.   If the findings are not amenable to percutaneous drainage, and do not significantly improve with antibiotic therapy, surgical consultation for I and D is warranted.   I have discussed the findings and recommendations with the patient. If applicable, a reminder letter will be sent to the patient regarding the next appointment.   BI-RADS CATEGORY  3: Probably benign.  Assessment and Plan: This is a 51 y.o. female with recurrent left breast abscess  --Discussed with the patient that given the persistent drainage, though less likely to be purulent, we may need to leave the wound open after excising the abscess cavity.  If left open, would then be doing packing dressing changes with iodoform gauze, either 1/4 or 1/2 inch.  All will depend on how the tissue looks during surgery and if there is any true purulence.  She's in agreement and understood the dressing instructions. --Reviewed the plan for surgery again with her and all of her questions have been answered.  She knows to keep her Preop testing appointment tomorrow morning.  I spent 15 minutes dedicated to the care of this patient on the date of this encounter to include pre-visit review of records, face-to-face time with the patient discussing diagnosis and management, and any post-visit coordination of care.   Melvyn Neth, [facility]

## 2022-02-27 ENCOUNTER — Encounter
Admission: RE | Admit: 2022-02-27 | Discharge: 2022-02-27 | Disposition: A | Discharge status: Home / Self Care | Payer: BC Managed Care – PPO | Source: Ambulatory Visit | Attending: Surgery | Admitting: Surgery

## 2022-02-27 VITALS — Ht 65.0 in | Wt 197.0 lb

## 2022-02-27 DIAGNOSIS — I10 Essential (primary) hypertension: Secondary | ICD-10-CM

## 2022-02-27 DIAGNOSIS — Z01812 Encounter for preprocedural laboratory examination: Secondary | ICD-10-CM

## 2022-02-27 NOTE — Patient Instructions (Addendum)
Your procedure is scheduled on: Wednesday, June 14 Report to the Registration Desk on the 1st floor of the [facility]. To find out your arrival time, please call [phone] between 1PM - 3PM on: Tuesday, June 13 If your arrival time is 6:00 am, do not arrive prior to that time as the [place] entrance doors do not open until 6:00 am.  REMEMBER: Instructions that are not followed completely may result in serious medical risk, up to and including death; or upon the discretion of your surgeon and anesthesiologist your surgery may need to be rescheduled.  Do not eat food after midnight the night before surgery.  No gum chewing, lozengers or hard candies.  You may however, drink CLEAR liquids up to 2 hours before you are scheduled to arrive for your surgery. Do not drink anything within 2 hours of your scheduled arrival time.  Clear liquids include: - water  - apple juice without pulp - gatorade (not RED colors) - black coffee or tea (Do NOT add milk or creamers to the coffee or tea) Do NOT drink anything that is not on this list.  DO NOT TAKE ANY MEDICATIONS THE MORNING OF SURGERY   One week prior to surgery: Stop Anti-inflammatories (NSAIDS) such as Advil, Aleve, Ibuprofen, Motrin, Naproxen, Naprosyn and Aspirin based products such as Excedrin, Goodys Powder, BC Powder. Stop ANY OVER THE COUNTER supplements until after surgery. You may however, continue to take Tylenol if needed for pain up until the day of surgery.  No Alcohol for 24 hours before or after surgery.  No Smoking including e-cigarettes for 24 hours prior to surgery.  No chewable tobacco products for at least 6 hours prior to surgery.  No nicotine patches on the day of surgery.  Do not use any "recreational" drugs for at least a week prior to your surgery.  Please be advised that the combination of cocaine and anesthesia may have negative outcomes, up to and including death. If you test positive for cocaine,  your surgery will be cancelled.  On the morning of surgery brush your teeth with toothpaste and water, you may rinse your mouth with mouthwash if you wish. Do not swallow any toothpaste or mouthwash.  Shower using antibacterial soap prior to arriving at the hospital on the day of surgery.  Do not wear jewelry, make-up, hairpins, clips or nail polish.  Do not wear lotions, powders, or perfumes.   Do not shave body from the neck down 48 hours prior to surgery just in case you cut yourself which could leave a site for infection.   Contact lenses, hearing aids and dentures may not be worn into surgery.  Do not bring valuables to the hospital. [facility] is not responsible for any missing/lost belongings or valuables.   Notify your doctor if there is any change in your medical condition (cold, fever, infection).  Wear comfortable clothing (specific to your surgery type) to the hospital.  After surgery, you can help prevent lung complications by doing breathing exercises.  Take deep breaths and cough every 1-2 hours. Your doctor may order a device called an Incentive Spirometer to help you take deep breaths.  If you are being discharged the day of surgery, you will not be allowed to drive home. You will need a responsible adult (18 years or older) to drive you home and stay with you that night.   If you are taking public transportation, you will need to have a responsible adult (18 years or  older) with you. Please confirm with your physician that it is acceptable to use public transportation.   Please call the [facility] at [phone] if you have any questions about these instructions.  Surgery Visitation Policy:  Patients undergoing a surgery or procedure may have two family members or support persons with them as long as the person is not COVID-19 positive or experiencing its symptoms.

## 2022-02-28 ENCOUNTER — Ambulatory Visit: Payer: BC Managed Care – PPO | Admitting: Certified Registered"

## 2022-02-28 ENCOUNTER — Other Ambulatory Visit: Payer: Self-pay

## 2022-02-28 ENCOUNTER — Encounter: Payer: Self-pay | Admitting: Surgery

## 2022-02-28 ENCOUNTER — Encounter
Admission: RE | Disposition: A | Discharge status: Home / Self Care | Payer: Self-pay | Source: Ambulatory Visit | Attending: Surgery

## 2022-02-28 ENCOUNTER — Ambulatory Visit
Admission: RE | Admit: 2022-02-28 | Discharge: 2022-02-28 | Disposition: A | Discharge status: Home / Self Care | Payer: BC Managed Care – PPO | Source: Ambulatory Visit | Attending: Surgery | Admitting: Surgery

## 2022-02-28 DIAGNOSIS — Z87891 Personal history of nicotine dependence: Secondary | ICD-10-CM | POA: Diagnosis not present

## 2022-02-28 DIAGNOSIS — N611 Abscess of the breast and nipple: Secondary | ICD-10-CM | POA: Diagnosis not present

## 2022-02-28 DIAGNOSIS — Z01812 Encounter for preprocedural laboratory examination: Secondary | ICD-10-CM

## 2022-02-28 DIAGNOSIS — I10 Essential (primary) hypertension: Secondary | ICD-10-CM | POA: Diagnosis not present

## 2022-02-28 HISTORY — PX: BREAST CYST EXCISION: SHX579

## 2022-02-28 LAB — CBC
HCT: 36.3 % (ref 36.0–46.0)
Hemoglobin: 11.2 g/dL — ABNORMAL LOW (ref 12.0–15.0)
MCH: 24.7 pg — ABNORMAL LOW (ref 26.0–34.0)
MCHC: 30.9 g/dL (ref 30.0–36.0)
MCV: 80 fL (ref 80.0–100.0)
Platelets: 581 10*3/uL — ABNORMAL HIGH (ref 150–400)
RBC: 4.54 MIL/uL (ref 3.87–5.11)
RDW: 17.2 % — ABNORMAL HIGH (ref 11.5–15.5)
WBC: 6.1 10*3/uL (ref 4.0–10.5)
nRBC: 0 % (ref 0.0–0.2)

## 2022-02-28 LAB — POTASSIUM: Potassium: 3.9 mmol/L (ref 3.5–5.1)

## 2022-02-28 LAB — POCT PREGNANCY, URINE: Preg Test, Ur: NEGATIVE

## 2022-02-28 SURGERY — EXCISION, CYST, BREAST
Anesthesia: General | Site: Breast | Laterality: Left

## 2022-02-28 MED ORDER — CEFAZOLIN SODIUM-DEXTROSE 2-4 GM/100ML-% IV SOLN
2.0000 g | INTRAVENOUS | Status: AC
  Administered 2022-02-28: 2 g via INTRAVENOUS

## 2022-02-28 MED ORDER — EPHEDRINE 5 MG/ML INJ
INTRAVENOUS | Status: AC
  Filled 2022-02-28: qty 5

## 2022-02-28 MED ORDER — MIDAZOLAM HCL 2 MG/2ML IJ SOLN
INTRAMUSCULAR | Status: DC | PRN
  Administered 2022-02-28: 2 mg via INTRAVENOUS

## 2022-02-28 MED ORDER — EPHEDRINE SULFATE-NACL 50-0.9 MG/10ML-% IV SOSY
PREFILLED_SYRINGE | INTRAVENOUS | Status: DC | PRN
  Administered 2022-02-28: 10 mg via INTRAVENOUS

## 2022-02-28 MED ORDER — BUPIVACAINE LIPOSOME 1.3 % IJ SUSP
INTRAMUSCULAR | Status: AC
  Filled 2022-02-28: qty 10

## 2022-02-28 MED ORDER — PROPOFOL 10 MG/ML IV BOLUS
INTRAVENOUS | Status: AC
  Filled 2022-02-28: qty 20

## 2022-02-28 MED ORDER — DEXAMETHASONE SODIUM PHOSPHATE 10 MG/ML IJ SOLN
INTRAMUSCULAR | Status: DC | PRN
  Administered 2022-02-28: 10 mg via INTRAVENOUS

## 2022-02-28 MED ORDER — CHLORHEXIDINE GLUCONATE CLOTH 2 % EX PADS
6.0000 | MEDICATED_PAD | Freq: Once | CUTANEOUS | Status: AC
  Administered 2022-02-28: 6 via TOPICAL

## 2022-02-28 MED ORDER — DEXMEDETOMIDINE HCL IN NACL 200 MCG/50ML IV SOLN
INTRAVENOUS | Status: DC | PRN
  Administered 2022-02-28: 4 ug via INTRAVENOUS
  Administered 2022-02-28: 8 ug via INTRAVENOUS
  Administered 2022-02-28: 4 ug via INTRAVENOUS

## 2022-02-28 MED ORDER — OXYCODONE HCL 5 MG PO TABS: 5.0000 mg | ORAL_TABLET | ORAL | 0 refills | Status: DC | PRN

## 2022-02-28 MED ORDER — FENTANYL CITRATE (PF) 100 MCG/2ML IJ SOLN: 25.0000 ug | INTRAMUSCULAR | Status: DC | PRN

## 2022-02-28 MED ORDER — CEFAZOLIN SODIUM-DEXTROSE 2-4 GM/100ML-% IV SOLN
INTRAVENOUS | Status: AC
  Filled 2022-02-28: qty 100

## 2022-02-28 MED ORDER — FAMOTIDINE 20 MG PO TABS
ORAL_TABLET | ORAL | Status: AC
  Filled 2022-02-28: qty 1

## 2022-02-28 MED ORDER — LIDOCAINE HCL (CARDIAC) PF 100 MG/5ML IV SOSY
PREFILLED_SYRINGE | INTRAVENOUS | Status: DC | PRN
  Administered 2022-02-28: 100 mg via INTRAVENOUS

## 2022-02-28 MED ORDER — GABAPENTIN 300 MG PO CAPS
300.0000 mg | ORAL_CAPSULE | ORAL | Status: AC
  Administered 2022-02-28: 300 mg via ORAL

## 2022-02-28 MED ORDER — BUPIVACAINE LIPOSOME 1.3 % IJ SUSP: 20.0000 mL | Freq: Once | INTRAMUSCULAR | Status: DC

## 2022-02-28 MED ORDER — ONDANSETRON HCL 4 MG/2ML IJ SOLN
INTRAMUSCULAR | Status: DC | PRN
  Administered 2022-02-28: 4 mg via INTRAVENOUS

## 2022-02-28 MED ORDER — FENTANYL CITRATE (PF) 100 MCG/2ML IJ SOLN
INTRAMUSCULAR | Status: DC | PRN
  Administered 2022-02-28 (×2): 50 ug via INTRAVENOUS

## 2022-02-28 MED ORDER — CHLORHEXIDINE GLUCONATE CLOTH 2 % EX PADS: 6.0000 | MEDICATED_PAD | Freq: Once | CUTANEOUS | Status: DC

## 2022-02-28 MED ORDER — ACETAMINOPHEN 500 MG PO TABS
ORAL_TABLET | ORAL | Status: AC
  Filled 2022-02-28: qty 2

## 2022-02-28 MED ORDER — FAMOTIDINE 20 MG PO TABS
20.0000 mg | ORAL_TABLET | Freq: Once | ORAL | Status: AC
  Administered 2022-02-28: 20 mg via ORAL

## 2022-02-28 MED ORDER — GABAPENTIN 300 MG PO CAPS
ORAL_CAPSULE | ORAL | Status: AC
  Filled 2022-02-28: qty 1

## 2022-02-28 MED ORDER — SODIUM CHLORIDE (PF) 0.9 % IJ SOLN
INTRAMUSCULAR | Status: AC
  Filled 2022-02-28: qty 10

## 2022-02-28 MED ORDER — METHYLENE BLUE 1 % INJ SOLN
INTRAVENOUS | Status: AC
  Filled 2022-02-28: qty 10

## 2022-02-28 MED ORDER — CHLORHEXIDINE GLUCONATE 0.12 % MT SOLN
15.0000 mL | Freq: Once | OROMUCOSAL | Status: AC
  Administered 2022-02-28: 15 mL via OROMUCOSAL

## 2022-02-28 MED ORDER — BUPIVACAINE-EPINEPHRINE (PF) 0.5% -1:200000 IJ SOLN
INTRAMUSCULAR | Status: DC | PRN
  Administered 2022-02-28: 30 mL

## 2022-02-28 MED ORDER — STERILE WATER FOR IRRIGATION IR SOLN
Status: DC | PRN
  Administered 2022-02-28: 120 mL

## 2022-02-28 MED ORDER — ACETAMINOPHEN 500 MG PO TABS: 1000.0000 mg | ORAL_TABLET | Freq: Four times a day (QID) | ORAL | Status: DC | PRN

## 2022-02-28 MED ORDER — CHLORHEXIDINE GLUCONATE 0.12 % MT SOLN
OROMUCOSAL | Status: AC
  Filled 2022-02-28: qty 15

## 2022-02-28 MED ORDER — MIDAZOLAM HCL 2 MG/2ML IJ SOLN
INTRAMUSCULAR | Status: AC
  Filled 2022-02-28: qty 2

## 2022-02-28 MED ORDER — ONDANSETRON HCL 4 MG/2ML IJ SOLN
INTRAMUSCULAR | Status: AC
  Filled 2022-02-28: qty 2

## 2022-02-28 MED ORDER — LACTATED RINGERS IV SOLN: INTRAVENOUS | Status: DC

## 2022-02-28 MED ORDER — DEXAMETHASONE SODIUM PHOSPHATE 10 MG/ML IJ SOLN
INTRAMUSCULAR | Status: AC
  Filled 2022-02-28: qty 1

## 2022-02-28 MED ORDER — ACETAMINOPHEN 500 MG PO TABS
1000.0000 mg | ORAL_TABLET | ORAL | Status: AC
  Administered 2022-02-28: 1000 mg via ORAL

## 2022-02-28 MED ORDER — BUPIVACAINE-EPINEPHRINE (PF) 0.5% -1:200000 IJ SOLN
INTRAMUSCULAR | Status: AC
  Filled 2022-02-28: qty 30

## 2022-02-28 MED ORDER — FENTANYL CITRATE (PF) 100 MCG/2ML IJ SOLN
INTRAMUSCULAR | Status: AC
  Filled 2022-02-28: qty 2

## 2022-02-28 MED ORDER — LIDOCAINE HCL (PF) 2 % IJ SOLN
INTRAMUSCULAR | Status: AC
  Filled 2022-02-28: qty 5

## 2022-02-28 MED ORDER — IBUPROFEN 600 MG PO TABS: 600.0000 mg | ORAL_TABLET | Freq: Three times a day (TID) | ORAL | 1 refills | Status: DC | PRN

## 2022-02-28 MED ORDER — KETOROLAC TROMETHAMINE 30 MG/ML IJ SOLN
INTRAMUSCULAR | Status: AC
  Filled 2022-02-28: qty 1

## 2022-02-28 MED ORDER — PROPOFOL 10 MG/ML IV BOLUS
INTRAVENOUS | Status: DC | PRN
  Administered 2022-02-28: 50 mg via INTRAVENOUS
  Administered 2022-02-28: 150 mg via INTRAVENOUS

## 2022-02-28 MED ORDER — KETOROLAC TROMETHAMINE 30 MG/ML IJ SOLN
INTRAMUSCULAR | Status: DC | PRN
  Administered 2022-02-28: 30 mg via INTRAVENOUS

## 2022-02-28 MED ORDER — ORAL CARE MOUTH RINSE: 15.0000 mL | Freq: Once | OROMUCOSAL | Status: AC

## 2022-02-28 MED ORDER — PHENYLEPHRINE HCL (PRESSORS) 10 MG/ML IV SOLN
INTRAVENOUS | Status: DC | PRN
  Administered 2022-02-28: 80 ug via INTRAVENOUS

## 2022-02-28 SURGICAL SUPPLY — 34 items
BINDER BREAST XLRG (GAUZE/BANDAGES/DRESSINGS) ×1 IMPLANT
BLADE SURG 15 STRL LF DISP TIS (BLADE) ×2 IMPLANT
BLADE SURG 15 STRL SS (BLADE) ×2
CHLORAPREP W/TINT 26 (MISCELLANEOUS) ×2 IMPLANT
DRAPE LAPAROTOMY 100X77 ABD (DRAPES) ×2 IMPLANT
ELECT CAUTERY BLADE TIP 2.5 (TIP) ×2
ELECT REM PT RETURN 9FT ADLT (ELECTROSURGICAL) ×2
ELECTRODE CAUTERY BLDE TIP 2.5 (TIP) ×1 IMPLANT
ELECTRODE REM PT RTRN 9FT ADLT (ELECTROSURGICAL) ×1 IMPLANT
GAUZE PACKING 1/4 X5 YD (GAUZE/BANDAGES/DRESSINGS) ×1 IMPLANT
GAUZE SPONGE 4X4 12PLY STRL (GAUZE/BANDAGES/DRESSINGS) ×1 IMPLANT
GLOVE SURG SYN 7.0 (GLOVE) ×2 IMPLANT
GLOVE SURG SYN 7.0 PF PI (GLOVE) ×1 IMPLANT
GLOVE SURG SYN 7.5  E (GLOVE) ×1
GLOVE SURG SYN 7.5 E (GLOVE) ×1 IMPLANT
GLOVE SURG SYN 7.5 PF PI (GLOVE) ×1 IMPLANT
GOWN STRL REUS W/ TWL LRG LVL3 (GOWN DISPOSABLE) ×2 IMPLANT
GOWN STRL REUS W/TWL LRG LVL3 (GOWN DISPOSABLE) ×3
KIT TURNOVER KIT A (KITS) ×2 IMPLANT
MANIFOLD NEPTUNE II (INSTRUMENTS) ×2 IMPLANT
NDL HYPO 25X1 1.5 SAFETY (NEEDLE) ×1 IMPLANT
NEEDLE HYPO 22GX1.5 SAFETY (NEEDLE) ×2 IMPLANT
NEEDLE HYPO 25X1 1.5 SAFETY (NEEDLE) ×2 IMPLANT
PACK BASIN MINOR ARMC (MISCELLANEOUS) ×2 IMPLANT
SUT MNCRL 4-0 (SUTURE) ×1
SUT MNCRL 4-0 27XMFL (SUTURE) ×1
SUT SILK 3 0 SH 30 (SUTURE) ×2 IMPLANT
SUT VIC AB 3-0 SH 27 (SUTURE) ×1
SUT VIC AB 3-0 SH 27X BRD (SUTURE) ×1 IMPLANT
SUTURE MNCRL 4-0 27XMF (SUTURE) ×1 IMPLANT
SYR 10ML LL (SYRINGE) ×2 IMPLANT
SYR BULB IRRIG 60ML STRL (SYRINGE) ×2 IMPLANT
TAPE CLOTH SURG 4X10 WHT LF (GAUZE/BANDAGES/DRESSINGS) ×1 IMPLANT
WATER STERILE IRR 1000ML POUR (IV SOLUTION) ×2 IMPLANT

## 2022-02-28 NOTE — Anesthesia Postprocedure Evaluation (Signed)
Anesthesia Post Note  Patient: Angela French  Procedure(s) Performed: ABSCESS EXCISION BREAST, recurrent abscess (Left: Breast)  Patient location during evaluation: PACU Anesthesia Type: General Level of consciousness: awake and alert Pain management: pain level controlled Vital Signs Assessment: post-procedure vital signs reviewed and stable Respiratory status: spontaneous breathing, nonlabored ventilation, respiratory function stable and patient connected to nasal cannula oxygen Cardiovascular status: blood pressure returned to baseline and stable Postop Assessment: no apparent nausea or vomiting Anesthetic complications: no   No notable events documented.   Last Vitals:  Vitals:   02/28/22 1600 02/28/22 1611  BP:  (!) 144/87  Pulse: 72 73  Resp: (!) 30 15  Temp: (!) 36.1 C (!) 36.3 C  SpO2: 100% 100%    Last Pain:  Vitals:   02/28/22 1611  TempSrc: Temporal  PainSc: 0-No pain                 Arita Miss

## 2022-02-28 NOTE — Transfer of Care (Signed)
Immediate Anesthesia Transfer of Care Note  Patient: Angela French  Procedure(s) Performed: CYST EXCISION BREAST, recurrent abscess (Left: Breast)  Patient Location: PACU  Anesthesia Type:General  Level of Consciousness: awake  Airway & Oxygen Therapy: Patient Spontanous Breathing  Post-op Assessment: Report given to RN and Post -op Vital signs reviewed and stable  Post vital signs: Reviewed and stable  Last Vitals:  Vitals Value Taken Time  BP 171/95 02/28/22 1524  Temp 36.6 C 02/28/22 1524  Pulse 82 02/28/22 1528  Resp 17 02/28/22 1528  SpO2 99 % 02/28/22 1528  Vitals shown include unvalidated device data.  Last Pain:  Vitals:   02/28/22 1524  TempSrc:   PainSc: 0-No pain         Complications: No notable events documented.

## 2022-02-28 NOTE — Interval H&P Note (Signed)
History and Physical Interval Note:  02/28/2022 1:10 PM  Angela French  has presented today for surgery, with the diagnosis of recurrent left breast abscess.  The various methods of treatment have been discussed with the patient and family. After consideration of risks, benefits and other options for treatment, the patient has consented to  Procedure(s): CYST EXCISION BREAST, recurrent abscess (Left) as a surgical intervention.  The patient's history has been reviewed, patient examined, no change in status, stable for surgery.  I have reviewed the patient's chart and labs.  Questions were answered to the patient's satisfaction.     [name]

## 2022-02-28 NOTE — Op Note (Signed)
  Procedure Date:  02/28/2022  Pre-operative Diagnosis:  Recurrent left retroareolar abscess  Post-operative Diagnosis: Recurrent left retroareolar abscess  Procedure:  Excision of left retroareolar abscess cavity  Surgeon:  Melvyn Neth, MD  Assistant:  Darnelle Bos, PA-S  Anesthesia:  General endotracheal  Estimated Blood Loss:  5 ml  Specimens:   Culture swab, left retroareolar abscess Left retroareolar abscess cavity  Complications:  None  Indications for Procedure:  This is a 51 y.o. female with diagnosis of a recurrent retroareolar abscess, requiring excision procedure.  The risks of bleeding, abscess or infection, injury to surrounding structures, and need for further procedures were all discussed with the patient and was willing to proceed.  Description of Procedure: The patient was correctly identified in the preoperative area and brought into the operating room.  The patient was placed supine with VTE prophylaxis in place.  Appropriate time-outs were performed.  Anesthesia was induced and the patient was intubated.  Appropriate antibiotics were infused.  The patient's left breast was prepped and draped in usual sterile fashion.  A curvilinear incision was made encompassing the superior portion of the areola and the patient's draining wound.  Cautery was used to dissect down to healthy breast tissue, and the abscess cavity was excised completely.  The abscess cavity included the prior biopsy clip that the patient had in 2019.  The cavity extended mostly in the superior portion of the retroareolar space.  No purulence was noted.  After excision, the wound measured about 5 x 2 x 2.5 cm.  The cavity was irrigated thoroughly and 30 ml of Exparel solution mixed with 0.5% bupivacaine with epi was infiltrated subcutaneously.  The medial and lateral edges were approximated using combination of 3-0 Vicryl and 4-0 Monocryl to decrease the cavity size to about 2 x 2 x 2 cm.  Then the  cavity was packed with 1/4 inch gauze, dressed with 4x4 gauze and tape.  Breast binder was applied.  The patient was then emerged from anesthesia, extubated, and brought to the recovery room for further management.  The patient tolerated the procedure well and all counts were correct at the end of the case.   Melvyn Neth, MD

## 2022-02-28 NOTE — Anesthesia Preprocedure Evaluation (Addendum)
Anesthesia Evaluation  Patient identified by MRN, date of birth, ID band Patient awake    Reviewed: Allergy & Precautions, H&P , NPO status , Patient's Chart, lab work & pertinent test results, reviewed documented beta blocker date and time   History of Anesthesia Complications Negative for: history of anesthetic complications  Airway Mallampati: II  TM Distance: >3 FB Neck ROM: full    Dental  (+) Dental Advidsory Given, Missing, Teeth Intact   Pulmonary neg pulmonary ROS, former smoker,    Pulmonary exam normal breath sounds clear to auscultation       Cardiovascular Exercise Tolerance: Good hypertension, (-) angina(-) Past MI and (-) Cardiac Stents Normal cardiovascular exam(-) dysrhythmias (-) Valvular Problems/Murmurs Rhythm:regular Rate:Normal     Neuro/Psych negative neurological ROS  negative psych ROS   GI/Hepatic negative GI ROS, Neg liver ROS,   Endo/Other  negative endocrine ROS  Renal/GU negative Renal ROS  negative genitourinary   Musculoskeletal   Abdominal   Peds  Hematology negative hematology ROS (+)   Anesthesia Other Findings Past Medical History: No date: Arthritis     Comment:  knee 02/2022: Hammertoe of right foot No date: Hypertension 02/2022: Left breast abscess 2018: Osteoarthritis of left knee No date: Wears contact lenses   Reproductive/Obstetrics negative OB ROS                             Anesthesia Physical Anesthesia Plan  ASA: 2  Anesthesia Plan: General   Post-op Pain Management:    Induction: Intravenous  PONV Risk Score and Plan: 3 and Ondansetron, Dexamethasone, Treatment may vary due to age or medical condition and Midazolam  Airway Management Planned: LMA  Additional Equipment:   Intra-op Plan:   Post-operative Plan: Extubation in OR  Informed Consent: I have reviewed the patients History and Physical, chart, labs and discussed  the procedure including the risks, benefits and alternatives for the proposed anesthesia with the patient or authorized representative who has indicated his/her understanding and acceptance.     Dental Advisory Given  Plan Discussed with: Anesthesiologist, CRNA and Surgeon  Anesthesia Plan Comments:        Anesthesia Quick Evaluation

## 2022-02-28 NOTE — Discharge Instructions (Signed)

## 2022-02-28 NOTE — Anesthesia Procedure Notes (Signed)
Procedure Name: LMA Insertion Date/Time: 02/28/2022 2:06 PM  Performed by: Cammie Sickle, CRNAPre-anesthesia Checklist: Patient identified, Patient being monitored, Timeout performed, Emergency Drugs available and Suction available Patient Re-evaluated:Patient Re-evaluated prior to induction Oxygen Delivery Method: Circle system utilized Preoxygenation: Pre-oxygenation with 100% oxygen Induction Type: IV induction Ventilation: Mask ventilation without difficulty LMA: LMA inserted LMA Size: 4.0 Tube type: Oral Number of attempts: 1 Placement Confirmation: positive ETCO2 and breath sounds checked- equal and bilateral Tube secured with: Tape Dental Injury: Teeth and Oropharynx as per pre-operative assessment

## 2022-03-01 ENCOUNTER — Encounter: Payer: Self-pay | Admitting: Surgery

## 2022-03-02 LAB — SURGICAL PATHOLOGY

## 2022-03-06 ENCOUNTER — Telehealth: Payer: Self-pay

## 2022-03-06 MED ORDER — AMOXICILLIN-POT CLAVULANATE 875-125 MG PO TABS: 1.0000 | ORAL_TABLET | Freq: Two times a day (BID) | ORAL | 0 refills | Status: DC

## 2022-03-06 NOTE — Telephone Encounter (Signed)
Tried calling patient at this time to let her know the cultures grew bacteria and that we have sent Augmentin to her pharmacy-unable to leave message.

## 2022-03-07 NOTE — Telephone Encounter (Signed)
Message left for the patient letting her know that the abscess did grow out Strep. We have sent in a prescription for Augmentin to be taken for 7 days into her pharmacy. She may call back with any questions.

## 2022-03-08 LAB — AEROBIC/ANAEROBIC CULTURE W GRAM STAIN (SURGICAL/DEEP WOUND)

## 2022-03-13 ENCOUNTER — Encounter: Payer: Self-pay | Admitting: Physician Assistant

## 2022-03-13 ENCOUNTER — Other Ambulatory Visit: Payer: Self-pay

## 2022-03-13 ENCOUNTER — Ambulatory Visit (INDEPENDENT_AMBULATORY_CARE_PROVIDER_SITE_OTHER): Payer: BC Managed Care – PPO | Admitting: Physician Assistant

## 2022-03-13 VITALS — BP 139/96 | HR 78 | Temp 98.3°F | Ht 65.0 in | Wt 196.4 lb

## 2022-03-13 DIAGNOSIS — N611 Abscess of the breast and nipple: Secondary | ICD-10-CM

## 2022-03-13 DIAGNOSIS — Z09 Encounter for follow-up examination after completed treatment for conditions other than malignant neoplasm: Secondary | ICD-10-CM

## 2022-03-15 ENCOUNTER — Encounter: Payer: BC Managed Care – PPO | Admitting: Physician Assistant

## 2022-04-03 ENCOUNTER — Ambulatory Visit (INDEPENDENT_AMBULATORY_CARE_PROVIDER_SITE_OTHER): Payer: BC Managed Care – PPO | Admitting: Physician Assistant

## 2022-04-03 ENCOUNTER — Encounter: Payer: Self-pay | Admitting: Physician Assistant

## 2022-04-03 VITALS — BP 147/93 | HR 87 | Temp 99.0°F | Ht 65.0 in | Wt 200.0 lb

## 2022-04-03 DIAGNOSIS — Z09 Encounter for follow-up examination after completed treatment for conditions other than malignant neoplasm: Secondary | ICD-10-CM

## 2022-04-03 DIAGNOSIS — N611 Abscess of the breast and nipple: Secondary | ICD-10-CM

## 2022-04-03 NOTE — Patient Instructions (Signed)
Continue to pack the area once a day. Follow up here in 3 weeks. Call with any questions.

## 2022-04-03 NOTE — Progress Notes (Signed)
[facility] POST-OP OFFICE VISIT  04/03/2022  HPI: Angela French is a 51 y.o. female ~6 weeks s/p excision of left retroareolar abscess cavity with Dr Hampton Abbot   Doing well Wound is healing Continues to pack daily; using less and less No fever No new complaints  Vital signs: BP (!) 147/93   Pulse 87   Temp 99 F (37.2 C)   Ht '5\' 5"'$  (1.651 m)   Wt 200 lb (90.7 kg)   LMP 03/28/2022 (Exact Date)   SpO2 100%   BMI 33.28 kg/m    Physical Exam: Constitutional: Well appearing female, NAD Skin: Chaperone present, to the left breast there is an approximately 0.5 x 3 0.5 cm wound from th 11 to 2 o'clock positions superior to the areola, there is no longer any undermining inferiorly. The entire wound bed is healthy granulation tissue. No erythema or drainage. There was a vicryl suture present which I removed.   Assessment/Plan: This is a 51 y.o. female ~6 weeks s/p excision of left retroareolar abscess cavity   - Reviewed wound care; Continue to pack daily with packing strips, anticipate using less and less, ultimately transitioning to superficial dressings  - She will follow up in ~3 weeks for hopefully final wound check. She understands to call with questions/concerns in the interim.   -- Edison Simon, PA-C [facility] 04/03/2022, 2:18 PM M-F: 7am - 4pm

## 2022-04-24 ENCOUNTER — Other Ambulatory Visit: Payer: Self-pay

## 2022-04-24 ENCOUNTER — Ambulatory Visit (INDEPENDENT_AMBULATORY_CARE_PROVIDER_SITE_OTHER): Payer: BC Managed Care – PPO | Admitting: Physician Assistant

## 2022-04-24 ENCOUNTER — Encounter: Payer: Self-pay | Admitting: Physician Assistant

## 2022-04-24 VITALS — BP 126/81 | HR 87 | Temp 98.3°F | Ht 65.0 in | Wt 193.6 lb

## 2022-04-24 DIAGNOSIS — N611 Abscess of the breast and nipple: Secondary | ICD-10-CM

## 2022-04-24 DIAGNOSIS — Z09 Encounter for follow-up examination after completed treatment for conditions other than malignant neoplasm: Secondary | ICD-10-CM

## 2022-04-24 NOTE — Patient Instructions (Signed)
Stop packing the wound. Place a band aid over the area until healed.  Please see your follow up appointment listed below.

## 2022-04-24 NOTE — Progress Notes (Signed)
[facility] POST-OP OFFICE VISIT  04/24/2022  HPI: Angela French is a 51 y.o. female ~9 weeks s/p excision of left retroareolar abscess cavity with Dr Hampton Abbot   She is doing well Still packing; but this is much smaller No fever, chills Minimal serous drainage No other complaints   Vital signs: BP 126/81   Pulse 87   Temp 98.3 F (36.8 C) (Oral)   Ht '5\' 5"'$  (1.651 m)   Wt 193 lb 9.6 oz (87.8 kg)   LMP 03/28/2022 (Exact Date)   SpO2 99%   BMI 32.22 kg/m    Physical Exam: Constitutional: Well appearing female, NAD Skin: [name] present as chaperone, to the left breast there is an approximately 0.5 x 1 x 0.5 cm wound from the 11 to 2 o'clock positions superior to the areola, there is minimal depth and actually I think her packing has been hindering the ability for this wound to close completely. The entire wound bed is healthy granulation tissue. No erythema or drainage.   Assessment/Plan: This is a 51 y.o. female  ~9 weeks s/p excision of left retroareolar abscess cavity with Dr Hampton Abbot   - Wound care recommendation; Stop packing, transition to superficial dressings as needed. Okay to shower, still no submerging wound  - I will see her again in 1 month for quick wound check. She understands she can call with questions or concerns in the interim   -- Edison Simon, PA-C  [facility] 04/24/2022, 1:54 PM M-F: 7am - 4pm

## 2022-05-24 ENCOUNTER — Encounter: Payer: BC Managed Care – PPO | Admitting: Physician Assistant

## 2022-08-03 ENCOUNTER — Encounter: Payer: Self-pay | Admitting: Family Medicine

## 2022-08-03 ENCOUNTER — Ambulatory Visit (INDEPENDENT_AMBULATORY_CARE_PROVIDER_SITE_OTHER): Payer: BC Managed Care – PPO | Admitting: Family Medicine

## 2022-08-03 VITALS — BP 120/78 | HR 92 | Ht 65.0 in | Wt 190.0 lb

## 2022-08-03 DIAGNOSIS — I10 Essential (primary) hypertension: Secondary | ICD-10-CM | POA: Diagnosis not present

## 2022-08-03 DIAGNOSIS — N921 Excessive and frequent menstruation with irregular cycle: Secondary | ICD-10-CM | POA: Diagnosis not present

## 2022-08-03 DIAGNOSIS — Z23 Encounter for immunization: Secondary | ICD-10-CM

## 2022-08-03 MED ORDER — HYDROCHLOROTHIAZIDE 12.5 MG PO TABS: 12.5000 mg | ORAL_TABLET | Freq: Every day | ORAL | 1 refills | Status: DC

## 2022-08-03 NOTE — Progress Notes (Signed)
Date:  08/03/2022   Name:  Angela French   DOB:  1971/06/08   MRN:  [id number]   Chief Complaint: Hypertension  Hypertension This is a chronic problem. The current episode started more than 1 year ago. The problem has been gradually improving since onset. The problem is controlled. Pertinent negatives include no blurred vision, chest pain, headaches, orthopnea, palpitations, peripheral edema, PND or shortness of breath. Risk factors for coronary artery disease include dyslipidemia.    Lab Results  Component Value Date   NA 140 01/04/2022   K 3.9 02/28/2022   CO2 21 01/04/2022   GLUCOSE 82 01/04/2022   BUN 5 (L) 01/04/2022   CREATININE 0.82 01/04/2022   CALCIUM 9.3 01/04/2022   EGFR 88 01/04/2022   GFRNONAA 87 05/12/2020   Lab Results  Component Value Date   CHOL 106 01/04/2022   HDL 54 01/04/2022   LDLCALC 34 01/04/2022   TRIG 95 01/04/2022   CHOLHDL 1.9 06/24/2019   No results found for: "TSH" No results found for: "HGBA1C" Lab Results  Component Value Date   WBC 6.1 02/28/2022   HGB 11.2 (L) 02/28/2022   HCT 36.3 02/28/2022   MCV 80.0 02/28/2022   PLT 581 (H) 02/28/2022   Lab Results  Component Value Date   ALT 15 02/07/2017   AST 20 02/07/2017   ALKPHOS 65 02/07/2017   BILITOT 0.5 02/07/2017   No results found for: "25OHVITD2", "25OHVITD3", "VD25OH"   Review of Systems  Constitutional: Negative.  Negative for chills, fatigue, fever and unexpected weight change.  HENT:  Negative for congestion, ear discharge, ear pain, rhinorrhea, sinus pressure, sneezing and sore throat.   Eyes:  Negative for blurred vision.  Respiratory:  Negative for cough, shortness of breath, wheezing and stridor.   Cardiovascular:  Negative for chest pain, palpitations, orthopnea and PND.  Gastrointestinal:  Negative for abdominal pain, blood in stool, constipation, diarrhea and nausea.  Genitourinary:  Negative for dysuria, flank pain, frequency, hematuria, urgency and vaginal  discharge.  Musculoskeletal:  Negative for arthralgias, back pain and myalgias.  Skin:  Negative for rash.  Neurological:  Negative for dizziness, weakness and headaches.  Hematological:  Negative for adenopathy. Does not bruise/bleed easily.  Psychiatric/Behavioral:  Negative for dysphoric mood. The patient is not nervous/anxious.     Patient Active Problem List   Diagnosis Date Noted   Left breast abscess    Encounter for screening colonoscopy    Obesity 07/26/2017   Status post arthroscopy of left knee 07/26/2017   Primary osteoarthritis of left knee 03/29/2017    No Known Allergies  Past Surgical History:  Procedure Laterality Date   BREAST BIOPSY Left 04/22/2018   US Aspiration, US biopsy and Lymph node biopsy   BREAST CYST EXCISION Left    neg   BREAST CYST EXCISION Left 02/28/2022   Procedure: ABSCESS EXCISION BREAST, recurrent abscess;  Surgeon: Olean Ree, MD;  Location: [facility];  Service: General;  Laterality: Left;   COLONOSCOPY WITH PROPOFOL N/A 01/26/2022   Procedure: COLONOSCOPY WITH PROPOFOL;  Surgeon: [name], MD;  Location: [facility];  Service: Endoscopy;  Laterality: N/A;   FOOT SURGERY Bilateral 2010   bunions;hammertoe   KNEE ARTHROSCOPY Left 04/12/2017   Procedure: ARTHROSCOPY KNEE with chonadroplasty with partial menisectomy;  Surgeon: Leanor Kail, MD;  Location: [facility];  Service: Orthopedics;  Laterality: Left;    Social History   Tobacco Use   Smoking status: Former    Types: Cigarettes  Quit date: 01/31/2010    Years since quitting: 12.5   Smokeless tobacco: Never  Vaping Use   Vaping Use: Never used  Substance Use Topics   Alcohol use: Not Currently    Comment: occasssional   Drug use: No     Medication list has been reviewed and updated.  Current Meds  Medication Sig   acetaminophen (TYLENOL) 500 MG tablet Take 1,000 mg by mouth daily as needed for moderate pain.   hydrochlorothiazide  (HYDRODIURIL) 12.5 MG tablet Take 12.5 mg by mouth daily.   ibuprofen (ADVIL) 600 MG tablet Take 1 tablet (600 mg total) by mouth every 8 (eight) hours as needed for moderate pain.   oxyCODONE (OXY IR/ROXICODONE) 5 MG immediate release tablet Take 1 tablet (5 mg total) by mouth every 4 (four) hours as needed for severe pain.   [DISCONTINUED] celecoxib (CELEBREX) 100 MG capsule Take 1 capsule twice a day by oral route.       08/03/2022    3:06 PM 02/02/2022    2:54 PM 01/04/2022    9:20 AM 05/12/2020   10:15 AM  GAD 7 : Generalized Anxiety Score  Nervous, Anxious, on Edge 0 0 0 0  Control/stop worrying 0 0 0 0  Worry too much - different things 0 0 0 0  Trouble relaxing 0 0 0 0  Restless 0 0 0 0  Easily annoyed or irritable 1 0 0 0  Afraid - awful might happen 1 0 0 1  Total GAD 7 Score 2 0 0 1  Anxiety Difficulty Not difficult at all Not difficult at all Not difficult at all Not difficult at all       08/03/2022    3:05 PM 02/02/2022    2:54 PM 01/04/2022    9:20 AM  Depression screen PHQ 2/9  Decreased Interest 0 0 0  Down, Depressed, Hopeless 0 0 0  PHQ - 2 Score 0 0 0  Altered sleeping 0 0 0  Tired, decreased energy 0 0 0  Change in appetite 0 0 0  Feeling bad or failure about yourself  0 0 0  Trouble concentrating 1 0 0  Moving slowly or fidgety/restless 0 0 0  Suicidal thoughts 0 0 0  PHQ-9 Score 1 0 0  Difficult doing work/chores Not difficult at all Not difficult at all Not difficult at all    BP Readings from Last 3 Encounters:  08/03/22 120/78  04/24/22 126/81  04/03/22 (!) 147/93    Physical Exam Vitals and nursing note reviewed. Exam conducted with a chaperone present.  Constitutional:      General: She is not in acute distress.    Appearance: She is not diaphoretic.  HENT:     Head: Normocephalic and atraumatic.     Right Ear: External ear normal.     Left Ear: External ear normal.     Nose: Nose normal.     Mouth/Throat:     Mouth: Mucous membranes  are moist.  Eyes:     General:        Right eye: No discharge.        Left eye: No discharge.     Conjunctiva/sclera: Conjunctivae normal.     Pupils: Pupils are equal, round, and reactive to light.  Neck:     Thyroid: No thyromegaly.     Vascular: No JVD.  Cardiovascular:     Rate and Rhythm: Normal rate and regular rhythm.     Heart sounds: Normal  heart sounds. No murmur heard.    No friction rub. No gallop.  Pulmonary:     Effort: Pulmonary effort is normal.     Breath sounds: Normal breath sounds. No wheezing or rhonchi.  Abdominal:     General: Bowel sounds are normal.     Palpations: Abdomen is soft. There is no mass.     Tenderness: There is no abdominal tenderness. There is no guarding or rebound.     Hernia: No hernia is present.  Musculoskeletal:        General: Normal range of motion.     Cervical back: Normal range of motion and neck supple.  Lymphadenopathy:     Cervical: No cervical adenopathy.  Skin:    General: Skin is warm and dry.  Neurological:     Mental Status: She is alert.     Deep Tendon Reflexes: Reflexes are normal and symmetric.     Wt Readings from Last 3 Encounters:  08/03/22 190 lb (86.2 kg)  04/24/22 193 lb 9.6 oz (87.8 kg)  04/03/22 200 lb (90.7 kg)    BP 120/78   Pulse 92   Ht _0  (1.651 m)   Wt 190 lb (86.2 kg)   SpO2 98%   BMI 31.62 kg/m   Assessment and Plan:  1. Primary hypertension Chronic.  Controlled.  Stable.  Blood pressure today 120/78.  Asymptomatic.  Tolerating medication well.  Continue hydrochlorothiazide 12.5 mg once a day.  We will recheck in 6 months - hydrochlorothiazide (HYDRODIURIL) 12.5 MG tablet; Take 1 tablet (12.5 mg total) by mouth daily.  Dispense: 90 tablet; Refill: 1   2. Menorrhagia with irregular cycle New onset.  Episodic.  Patient is having irregular periods that are prolonged with heavy bleeding.  Patient needs to be reevaluated by her GYN and we have placed referral for follow-up. -  Ambulatory referral to Gynecology    Otilio Miu, MD

## 2022-08-14 ENCOUNTER — Telehealth: Payer: Self-pay

## 2022-08-14 NOTE — Telephone Encounter (Signed)
Called and left a message for pt to go get labs drawn from [facility] this week

## 2022-08-16 DIAGNOSIS — I10 Essential (primary) hypertension: Secondary | ICD-10-CM | POA: Diagnosis not present

## 2022-08-17 LAB — RENAL FUNCTION PANEL
Albumin: 4.5 g/dL (ref 3.9–4.9)
BUN/Creatinine Ratio: 12 (ref 9–23)
BUN: 10 mg/dL (ref 6–24)
CO2: 25 mmol/L (ref 20–29)
Calcium: 9.4 mg/dL (ref 8.7–10.2)
Chloride: 100 mmol/L (ref 96–106)
Creatinine, Ser: 0.86 mg/dL (ref 0.57–1.00)
Glucose: 85 mg/dL (ref 70–99)
Phosphorus: 3.6 mg/dL (ref 3.0–4.3)
Potassium: 4.1 mmol/L (ref 3.5–5.2)
Sodium: 142 mmol/L (ref 134–144)
eGFR: 83 mL/min/{1.73_m2} (ref >59)

## 2022-11-27 DIAGNOSIS — Z1339 Encounter for screening examination for other mental health and behavioral disorders: Secondary | ICD-10-CM | POA: Diagnosis not present

## 2022-11-27 DIAGNOSIS — Z113 Encounter for screening for infections with a predominantly sexual mode of transmission: Secondary | ICD-10-CM | POA: Diagnosis not present

## 2022-11-27 DIAGNOSIS — Z1331 Encounter for screening for depression: Secondary | ICD-10-CM | POA: Diagnosis not present

## 2022-11-27 DIAGNOSIS — Z01419 Encounter for gynecological examination (general) (routine) without abnormal findings: Secondary | ICD-10-CM | POA: Diagnosis not present

## 2022-11-27 DIAGNOSIS — I499 Cardiac arrhythmia, unspecified: Secondary | ICD-10-CM | POA: Diagnosis not present

## 2022-11-28 ENCOUNTER — Other Ambulatory Visit: Payer: Self-pay | Admitting: Family Medicine

## 2022-11-28 DIAGNOSIS — N611 Abscess of the breast and nipple: Secondary | ICD-10-CM

## 2022-11-28 DIAGNOSIS — R928 Other abnormal and inconclusive findings on diagnostic imaging of breast: Secondary | ICD-10-CM

## 2022-12-27 DIAGNOSIS — D259 Leiomyoma of uterus, unspecified: Secondary | ICD-10-CM | POA: Insufficient documentation

## 2022-12-27 DIAGNOSIS — N920 Excessive and frequent menstruation with regular cycle: Secondary | ICD-10-CM | POA: Diagnosis not present

## 2023-02-14 ENCOUNTER — Ambulatory Visit
Admission: RE | Admit: 2023-02-14 | Discharge: 2023-02-14 | Disposition: A | Discharge status: Home / Self Care | Payer: BC Managed Care – PPO | Source: Ambulatory Visit | Attending: Family Medicine | Admitting: Family Medicine

## 2023-02-14 DIAGNOSIS — R928 Other abnormal and inconclusive findings on diagnostic imaging of breast: Secondary | ICD-10-CM

## 2023-02-14 DIAGNOSIS — R92323 Mammographic fibroglandular density, bilateral breasts: Secondary | ICD-10-CM | POA: Diagnosis not present

## 2023-02-14 DIAGNOSIS — N611 Abscess of the breast and nipple: Secondary | ICD-10-CM

## 2023-02-28 ENCOUNTER — Ambulatory Visit (INDEPENDENT_AMBULATORY_CARE_PROVIDER_SITE_OTHER): Payer: BC Managed Care – PPO | Admitting: Family Medicine

## 2023-02-28 ENCOUNTER — Encounter: Payer: Self-pay | Admitting: Family Medicine

## 2023-02-28 VITALS — BP 120/78 | HR 70 | Ht 65.0 in | Wt 194.0 lb

## 2023-02-28 DIAGNOSIS — F329 Major depressive disorder, single episode, unspecified: Secondary | ICD-10-CM

## 2023-02-28 DIAGNOSIS — M1711 Unilateral primary osteoarthritis, right knee: Secondary | ICD-10-CM | POA: Diagnosis not present

## 2023-02-28 MED ORDER — IBUPROFEN 800 MG PO TABS: 800.0000 mg | ORAL_TABLET | Freq: Three times a day (TID) | ORAL | 3 refills | Status: DC | PRN

## 2023-02-28 MED ORDER — SERTRALINE HCL 25 MG PO TABS: 25.0000 mg | ORAL_TABLET | Freq: Every day | ORAL | 3 refills | Status: DC

## 2023-02-28 NOTE — Progress Notes (Signed)
Date:  02/28/2023   Name:  Angela French   DOB:  [birth year]   MRN:  [id number]   Chief Complaint: Knee Pain (Bilateral knee pain radiating up leg on R) leg. Stays in knee on L) side. Saw Dedra Skeens in 2020- Dx osteoarthritis of L) knee then) and Depression (8 and 2)  Knee Pain  There was no injury mechanism. The pain is present in the right knee and left knee. The pain is moderate. Associated symptoms include a loss of motion. Pertinent negatives include no inability to bear weight. The symptoms are aggravated by weight bearing and movement. She has tried NSAIDs (aleve) for the symptoms. The treatment provided mild relief.  Depression        This is a chronic problem.  The current episode started more than 1 month ago.   The onset quality is sudden.   The problem occurs intermittently.  The problem has been waxing and waning since onset.  Associated symptoms include hopelessness, insomnia, decreased interest and sad.  Associated symptoms include no fatigue, no myalgias, no headaches and no suicidal ideas.     The symptoms are aggravated by nothing.   Lab Results  Component Value Date   NA 142 08/16/2022   K 4.1 08/16/2022   CO2 25 08/16/2022   GLUCOSE 85 08/16/2022   BUN 10 08/16/2022   CREATININE 0.86 08/16/2022   CALCIUM 9.4 08/16/2022   EGFR 83 08/16/2022   GFRNONAA 87 05/12/2020   Lab Results  Component Value Date   CHOL 106 01/04/2022   HDL 54 01/04/2022   LDLCALC 34 01/04/2022   TRIG 95 01/04/2022   CHOLHDL 1.9 06/24/2019   No results found for: "TSH" No results found for: "HGBA1C" Lab Results  Component Value Date   WBC 6.1 02/28/2022   HGB 11.2 (L) 02/28/2022   HCT 36.3 02/28/2022   MCV 80.0 02/28/2022   PLT 581 (H) 02/28/2022   Lab Results  Component Value Date   ALT 15 02/07/2017   AST 20 02/07/2017   ALKPHOS 65 02/07/2017   BILITOT 0.5 02/07/2017   No results found for: "25OHVITD2", "25OHVITD3", "VD25OH"   Review of Systems  Constitutional:  Negative.  Negative for chills, fatigue, fever and unexpected weight change.  HENT:  Negative for congestion, ear discharge, ear pain, rhinorrhea, sinus pressure, sneezing and sore throat.   Respiratory:  Negative for cough, shortness of breath, wheezing and stridor.   Gastrointestinal:  Negative for abdominal pain, blood in stool, constipation, diarrhea and nausea.  Genitourinary:  Negative for dysuria, flank pain, frequency, hematuria, urgency and vaginal discharge.  Musculoskeletal:  Negative for arthralgias, back pain and myalgias.  Skin:  Negative for rash.  Neurological:  Negative for dizziness, weakness and headaches.  Hematological:  Negative for adenopathy. Does not bruise/bleed easily.  Psychiatric/Behavioral:  Positive for depression. Negative for dysphoric mood and suicidal ideas. The patient has insomnia. The patient is not nervous/anxious.     Patient Active Problem List   Diagnosis Date Noted   Left breast abscess    Encounter for screening colonoscopy    Obesity 07/26/2017   Status post arthroscopy of left knee 07/26/2017   Primary osteoarthritis of left knee 03/29/2017    No Known Allergies  Past Surgical History:  Procedure Laterality Date   BREAST BIOPSY Left 04/22/2018   US Aspiration, US biopsy and Lymph node biopsy   BREAST CYST EXCISION Left    neg   BREAST CYST EXCISION Left 02/28/2022  Procedure: ABSCESS EXCISION BREAST, recurrent abscess;  Surgeon: Henrene Dodge, MD;  Location: [facility];  Service: General;  Laterality: Left;   COLONOSCOPY WITH PROPOFOL N/A 01/26/2022   Procedure: COLONOSCOPY WITH PROPOFOL;  Surgeon: Midge Minium, MD;  Location: [facility];  Service: Endoscopy;  Laterality: N/A;   FOOT SURGERY Bilateral 2010   bunions;hammertoe   KNEE ARTHROSCOPY Left 04/12/2017   Procedure: ARTHROSCOPY KNEE with chonadroplasty with partial menisectomy;  Surgeon: Erin Sons, MD;  Location: [facility];  Service: Orthopedics;   Laterality: Left;    Social History   Tobacco Use   Smoking status: Former    Types: Cigarettes    Quit date: 01/31/2010    Years since quitting: 13.0   Smokeless tobacco: Never  Vaping Use   Vaping Use: Never used  Substance Use Topics   Alcohol use: Not Currently    Comment: occasssional   Drug use: No     Medication list has been reviewed and updated.  Current Meds  Medication Sig   acetaminophen (TYLENOL) 500 MG tablet Take 1,000 mg by mouth daily as needed for moderate pain.   hydrochlorothiazide (HYDRODIURIL) 12.5 MG tablet Take 1 tablet (12.5 mg total) by mouth daily.   [DISCONTINUED] ibuprofen (ADVIL) 600 MG tablet Take 1 tablet (600 mg total) by mouth every 8 (eight) hours as needed for moderate pain.       02/28/2023   10:01 AM 08/03/2022    3:06 PM 02/02/2022    2:54 PM 01/04/2022    9:20 AM  GAD 7 : Generalized Anxiety Score  Nervous, Anxious, on Edge 0 0 0 0  Control/stop worrying 0 0 0 0  Worry too much - different things 0 0 0 0  Trouble relaxing 0 0 0 0  Restless 0 0 0 0  Easily annoyed or irritable 2 1 0 0  Afraid - awful might happen 0 1 0 0  Total GAD 7 Score 2 2 0 0  Anxiety Difficulty Not difficult at all Not difficult at all Not difficult at all Not difficult at all       02/28/2023   10:00 AM 08/03/2022    3:05 PM 02/02/2022    2:54 PM  Depression screen PHQ 2/9  Decreased Interest 0 0 0  Down, Depressed, Hopeless 2 0 0  PHQ - 2 Score 2 0 0  Altered sleeping 3 0 0  Tired, decreased energy 1 0 0  Change in appetite 1 0 0  Feeling bad or failure about yourself  0 0 0  Trouble concentrating 0 1 0  Moving slowly or fidgety/restless 1 0 0  Suicidal thoughts 0 0 0  PHQ-9 Score 8 1 0  Difficult doing work/chores Very difficult Not difficult at all Not difficult at all    BP Readings from Last 3 Encounters:  02/28/23 120/78  08/03/22 120/78  04/24/22 126/81    Physical Exam Vitals and nursing note reviewed. Exam conducted with a  chaperone present.  Constitutional:      General: She is not in acute distress.    Appearance: She is not diaphoretic.  HENT:     Head: Normocephalic and atraumatic.     Right Ear: Tympanic membrane and external ear normal.     Left Ear: Tympanic membrane and external ear normal.     Nose: Nose normal.     Mouth/Throat:     Mouth: Mucous membranes are moist.  Eyes:     General:  Right eye: No discharge.        Left eye: No discharge.     Conjunctiva/sclera: Conjunctivae normal.     Pupils: Pupils are equal, round, and reactive to light.  Neck:     Thyroid: No thyromegaly.     Vascular: No JVD.  Cardiovascular:     Rate and Rhythm: Normal rate and regular rhythm.     Heart sounds: Normal heart sounds. No murmur heard.    No friction rub. No gallop.  Pulmonary:     Effort: Pulmonary effort is normal.     Breath sounds: Normal breath sounds. No rales.  Chest:     Chest wall: No tenderness.  Abdominal:     General: Bowel sounds are normal.     Palpations: Abdomen is soft. There is no mass.     Tenderness: There is no abdominal tenderness. There is no guarding.  Musculoskeletal:        General: Normal range of motion.     Cervical back: Normal range of motion and neck supple.  Lymphadenopathy:     Cervical: No cervical adenopathy.  Skin:    General: Skin is warm and dry.  Neurological:     Mental Status: She is alert.     Deep Tendon Reflexes: Reflexes are normal and symmetric.     Wt Readings from Last 3 Encounters:  02/28/23 194 lb (88 kg)  08/03/22 190 lb (86.2 kg)  04/24/22 193 lb 9.6 oz (87.8 kg)    BP 120/78   Pulse 70   Ht 5\' 5"  (1.651 m)   Wt 194 lb (88 kg)   LMP 01/30/2023   SpO2 99%   BMI 32.28 kg/m   Assessment and Plan:  1. Reactive depression New onset.  Uncontrolled.  Stable.  PHQ is 8.  GAD score is 2.  This is more of a reactive depression due to the relatively recent death of her daughter in January 12, 2023.  We will initiate sertraline 12.5 mg  for 10 days and then increase to 25 mg once a day and then recheck patient in 6 weeks.  Reiterated that patient has not suicidal nor in danger of self-harm by patient's admission. - sertraline (ZOLOFT) 25 MG tablet; Take 1 tablet (25 mg total) by mouth daily. Initiate at one half tabley a day for 10 day then one a day.  Dispense: 60 tablet; Refill: 3  2. Primary osteoarthritis of right knee Chronic.  Uncontrolled.  Currently not stable.  Right knee was seen by [facility] and a brace was initiated along with anti-inflammatories.  Patient was doing relatively sufficient on Aleve twice a day but pain started to increase thereupon and patient took an 800 mg which helped more but still had return of pain.  We will refill her NSAIDs but with ibuprofen 800 mg and encouraged to [facility] was with consideration of further evaluation of her right knee and that she has had surgery done to her left by Dr. Jerral Bonito in the past.  There is likely some cartilaginous and/or arthritic concerns that need to be dealt with may be with injections and/or eventual surgery as well. - ibuprofen (ADVIL) 800 MG tablet; Take 1 tablet (800 mg total) by mouth every 8 (eight) hours as needed.  Dispense: 60 tablet; Refill: 3    Elizabeth Sauer, MD

## 2023-03-11 DIAGNOSIS — M25461 Effusion, right knee: Secondary | ICD-10-CM | POA: Diagnosis not present

## 2023-03-22 ENCOUNTER — Ambulatory Visit (INDEPENDENT_AMBULATORY_CARE_PROVIDER_SITE_OTHER): Payer: BC Managed Care – PPO | Admitting: Family Medicine

## 2023-03-22 ENCOUNTER — Encounter: Payer: Self-pay | Admitting: Family Medicine

## 2023-03-22 VITALS — BP 120/76 | HR 86 | Ht 65.0 in | Wt 189.0 lb

## 2023-03-22 DIAGNOSIS — M25461 Effusion, right knee: Secondary | ICD-10-CM | POA: Diagnosis not present

## 2023-03-22 DIAGNOSIS — F329 Major depressive disorder, single episode, unspecified: Secondary | ICD-10-CM | POA: Diagnosis not present

## 2023-03-22 MED ORDER — SERTRALINE HCL 50 MG PO TABS: 50.0000 mg | ORAL_TABLET | Freq: Every day | ORAL | 1 refills | Status: DC

## 2023-03-22 NOTE — Progress Notes (Signed)
Date:  03/22/2023   Name:  Angela French   DOB:  [birth year]   MRN:  [id number]   Chief Complaint: Depression (Up to a whole pill a day- feeling "okay" PHQ- 8 to 3 and GAD 2-3)  Depression        This is a chronic problem.  The current episode started more than 1 year ago.   The onset quality is gradual.   The problem occurs intermittently.  The problem has been gradually improving since onset.  Associated symptoms include irritable, restlessness and appetite change.  Associated symptoms include no suicidal ideas.  Past treatments include SSRIs - Selective serotonin reuptake inhibitors (sertraline 25mg ).  Compliance with treatment is good.  Previous treatment provided mild relief.   Lab Results  Component Value Date   NA 142 08/16/2022   K 4.1 08/16/2022   CO2 25 08/16/2022   GLUCOSE 85 08/16/2022   BUN 10 08/16/2022   CREATININE 0.86 08/16/2022   CALCIUM 9.4 08/16/2022   EGFR 83 08/16/2022   GFRNONAA 87 05/12/2020   Lab Results  Component Value Date   CHOL 106 01/04/2022   HDL 54 01/04/2022   LDLCALC 34 01/04/2022   TRIG 95 01/04/2022   CHOLHDL 1.9 06/24/2019   No results found for: "TSH" No results found for: "HGBA1C" Lab Results  Component Value Date   WBC 6.1 02/28/2022   HGB 11.2 (L) 02/28/2022   HCT 36.3 02/28/2022   MCV 80.0 02/28/2022   PLT 581 (H) 02/28/2022   Lab Results  Component Value Date   ALT 15 02/07/2017   AST 20 02/07/2017   ALKPHOS 65 02/07/2017   BILITOT 0.5 02/07/2017   No results found for: "25OHVITD2", "25OHVITD3", "VD25OH"   Review of Systems  Constitutional:  Positive for appetite change.  HENT:  Negative for congestion.   Eyes:  Negative for visual disturbance.  Respiratory:  Negative for chest tightness, shortness of breath and wheezing.   Cardiovascular:  Negative for chest pain, palpitations and leg swelling.  Gastrointestinal:  Negative for abdominal distention.  Psychiatric/Behavioral:  Positive for depression. Negative for  suicidal ideas.     Patient Active Problem List   Diagnosis Date Noted   Left breast abscess    Encounter for screening colonoscopy    Obesity 07/26/2017   Status post arthroscopy of left knee 07/26/2017   Primary osteoarthritis of left knee 03/29/2017    No Known Allergies  Past Surgical History:  Procedure Laterality Date   BREAST BIOPSY Left 04/22/2018   US Aspiration, US biopsy and Lymph node biopsy   BREAST CYST EXCISION Left    neg   BREAST CYST EXCISION Left 02/28/2022   Procedure: ABSCESS EXCISION BREAST, recurrent abscess;  Surgeon: Henrene Dodge, MD;  Location: [facility];  Service: General;  Laterality: Left;   COLONOSCOPY WITH PROPOFOL N/A 01/26/2022   Procedure: COLONOSCOPY WITH PROPOFOL;  Surgeon: Midge Minium, MD;  Location: [facility];  Service: Endoscopy;  Laterality: N/A;   FOOT SURGERY Bilateral 2010   bunions;hammertoe   KNEE ARTHROSCOPY Left 04/12/2017   Procedure: ARTHROSCOPY KNEE with chonadroplasty with partial menisectomy;  Surgeon: Erin Sons, MD;  Location: [facility];  Service: Orthopedics;  Laterality: Left;    Social History   Tobacco Use   Smoking status: Former    Types: Cigarettes    Quit date: 01/31/2010    Years since quitting: 13.1   Smokeless tobacco: Never  Vaping Use   Vaping Use: Never used  Substance  Use Topics   Alcohol use: Not Currently    Comment: occasssional   Drug use: No     Medication list has been reviewed and updated.  Current Meds  Medication Sig   acetaminophen (TYLENOL) 500 MG tablet Take 1,000 mg by mouth daily as needed for moderate pain.   hydrochlorothiazide (HYDRODIURIL) 12.5 MG tablet Take 1 tablet (12.5 mg total) by mouth daily.   ibuprofen (ADVIL) 800 MG tablet Take 1 tablet (800 mg total) by mouth every 8 (eight) hours as needed.   sertraline (ZOLOFT) 25 MG tablet Take 1 tablet (25 mg total) by mouth daily. Initiate at one half tabley a day for 10 day then one a day.        03/22/2023   10:15 AM 02/28/2023   10:01 AM 08/03/2022    3:06 PM 02/02/2022    2:54 PM  GAD 7 : Generalized Anxiety Score  Nervous, Anxious, on Edge 0 0 0 0  Control/stop worrying 0 0 0 0  Worry too much - different things 0 0 0 0  Trouble relaxing 0 0 0 0  Restless 0 0 0 0  Easily annoyed or irritable 2 2 1  0  Afraid - awful might happen 1 0 1 0  Total GAD 7 Score 3 2 2  0  Anxiety Difficulty Not difficult at all Not difficult at all Not difficult at all Not difficult at all       03/22/2023   10:14 AM 02/28/2023   10:00 AM 08/03/2022    3:05 PM  Depression screen PHQ 2/9  Decreased Interest 0 0 0  Down, Depressed, Hopeless 0 2 0  PHQ - 2 Score 0 2 0  Altered sleeping 1 3 0  Tired, decreased energy 0 1 0  Change in appetite 1 1 0  Feeling bad or failure about yourself  0 0 0  Trouble concentrating 1 0 1  Moving slowly or fidgety/restless 0 1 0  Suicidal thoughts 0 0 0  PHQ-9 Score 3 8 1   Difficult doing work/chores Not difficult at all Very difficult Not difficult at all    BP Readings from Last 3 Encounters:  03/22/23 120/76  02/28/23 120/78  08/03/22 120/78    Physical Exam Constitutional:      General: She is irritable.  HENT:     Right Ear: Tympanic membrane, ear canal and external ear normal.     Left Ear: Tympanic membrane, ear canal and external ear normal.     Nose: Nose normal.     Mouth/Throat:     Mouth: Mucous membranes are moist.     Pharynx: No oropharyngeal exudate or posterior oropharyngeal erythema.  Eyes:     Extraocular Movements: Extraocular movements intact.     Pupils: Pupils are equal, round, and reactive to light.  Cardiovascular:     Rate and Rhythm: Normal rate.     Heart sounds: No murmur heard.    No gallop.  Pulmonary:     Breath sounds: No wheezing, rhonchi or rales.  Abdominal:     General: Abdomen is flat.     Wt Readings from Last 3 Encounters:  03/22/23 189 lb (85.7 kg)  02/28/23 194 lb (88 kg)  08/03/22 190 lb (86.2  kg)    BP 120/76   Pulse 86   Ht 5\' 5"  (1.651 m)   Wt 189 lb (85.7 kg)   LMP 03/13/2023 (Exact Date)   SpO2 99%   BMI 31.45 kg/m   Assessment  and Plan: 1. Reactive depression Chronic.  Controlled.  Stable.  Tolerating sertraline 25 mg well but since we have still some symptomatology on PHQ and GAD which is 3 a piece we will increase sertraline to 50 mg once a day and will recheck later this month for results. - sertraline (ZOLOFT) 50 MG tablet; Take 1 tablet (50 mg total) by mouth daily. Initiate at one half tabley a day for 10 day then one a day.  Dispense: 90 tablet; Refill: 1     Elizabeth Sauer, MD

## 2023-04-01 DIAGNOSIS — M13861 Other specified arthritis, right knee: Secondary | ICD-10-CM | POA: Diagnosis not present

## 2023-04-08 ENCOUNTER — Ambulatory Visit: Payer: BC Managed Care – PPO | Admitting: Family Medicine

## 2023-04-15 ENCOUNTER — Other Ambulatory Visit: Payer: Self-pay | Admitting: Family Medicine

## 2023-04-15 DIAGNOSIS — I10 Essential (primary) hypertension: Secondary | ICD-10-CM

## 2023-06-21 ENCOUNTER — Ambulatory Visit: Payer: BC Managed Care – PPO | Admitting: Family Medicine

## 2023-06-25 ENCOUNTER — Ambulatory Visit (INDEPENDENT_AMBULATORY_CARE_PROVIDER_SITE_OTHER): Payer: BC Managed Care – PPO | Admitting: Family Medicine

## 2023-06-25 ENCOUNTER — Encounter: Payer: Self-pay | Admitting: Family Medicine

## 2023-06-25 VITALS — BP 128/82 | HR 88 | Ht 65.0 in | Wt 181.0 lb

## 2023-06-25 DIAGNOSIS — F329 Major depressive disorder, single episode, unspecified: Secondary | ICD-10-CM

## 2023-06-25 DIAGNOSIS — Z23 Encounter for immunization: Secondary | ICD-10-CM | POA: Diagnosis not present

## 2023-06-25 DIAGNOSIS — I10 Essential (primary) hypertension: Secondary | ICD-10-CM

## 2023-06-25 MED ORDER — SERTRALINE HCL 50 MG PO TABS
50.0000 mg | ORAL_TABLET | Freq: Every day | ORAL | 1 refills | Status: DC
Start: 2023-06-25 — End: 2023-09-19

## 2023-06-25 MED ORDER — HYDROCHLOROTHIAZIDE 12.5 MG PO TABS
12.5000 mg | ORAL_TABLET | Freq: Every day | ORAL | 1 refills | Status: DC
Start: 2023-06-25 — End: 2023-09-19

## 2023-06-25 NOTE — Progress Notes (Signed)
Date:  06/25/2023   Name:  Angela French   DOB:  [birth year]   MRN:  [id number]   Chief Complaint: Hypertension and Anxiety  Hypertension This is a chronic problem. The current episode started more than 1 year ago. The problem has been gradually improving since onset. The problem is controlled. Associated symptoms include anxiety. Pertinent negatives include no blurred vision, chest pain, headaches, malaise/fatigue, neck pain, orthopnea, palpitations, peripheral edema, PND, shortness of breath or sweats. There are no associated agents to hypertension. Risk factors for coronary artery disease include dyslipidemia. Past treatments include diuretics. The current treatment provides moderate improvement. There are no compliance problems.  There is no history of angina, CAD/MI or CVA. There is no history of chronic renal disease, a hypertension causing med or renovascular disease.  Anxiety Presents for follow-up visit. Symptoms include nervous/anxious behavior and restlessness. Patient reports no chest pain, compulsions, confusion, decreased concentration, dizziness, excessive worry, insomnia, palpitations, shortness of breath or suicidal ideas.      Lab Results  Component Value Date   NA 142 08/16/2022   K 4.1 08/16/2022   CO2 25 08/16/2022   GLUCOSE 85 08/16/2022   BUN 10 08/16/2022   CREATININE 0.86 08/16/2022   CALCIUM 9.4 08/16/2022   EGFR 83 08/16/2022   GFRNONAA 87 05/12/2020   Lab Results  Component Value Date   CHOL 106 01/04/2022   HDL 54 01/04/2022   LDLCALC 34 01/04/2022   TRIG 95 01/04/2022   CHOLHDL 1.9 06/24/2019   No results found for: "TSH" No results found for: "HGBA1C" Lab Results  Component Value Date   WBC 6.1 02/28/2022   HGB 11.2 (L) 02/28/2022   HCT 36.3 02/28/2022   MCV 80.0 02/28/2022   PLT 581 (H) 02/28/2022   Lab Results  Component Value Date   ALT 15 02/07/2017   AST 20 02/07/2017   ALKPHOS 65 02/07/2017   BILITOT 0.5 02/07/2017   No  results found for: "25OHVITD2", "25OHVITD3", "VD25OH"   Review of Systems  Constitutional:  Negative for malaise/fatigue.  HENT:  Negative for congestion.   Eyes:  Negative for blurred vision and visual disturbance.  Respiratory:  Negative for cough, chest tightness, shortness of breath, wheezing and stridor.   Cardiovascular:  Negative for chest pain, palpitations, orthopnea, leg swelling and PND.  Gastrointestinal:  Negative for abdominal distention, abdominal pain, anal bleeding and blood in stool.  Musculoskeletal:  Negative for neck pain.  Neurological:  Negative for dizziness and headaches.  Psychiatric/Behavioral:  Negative for confusion, decreased concentration and suicidal ideas. The patient is nervous/anxious. The patient does not have insomnia.     Patient Active Problem List   Diagnosis Date Noted   Uterine leiomyoma 12/27/2022   Left breast abscess    Encounter for screening colonoscopy    Obesity 07/26/2017   Status post arthroscopy of left knee 07/26/2017   Primary osteoarthritis of left knee 03/29/2017    No Known Allergies  Past Surgical History:  Procedure Laterality Date   BREAST BIOPSY Left 04/22/2018   US Aspiration, US biopsy and Lymph node biopsy   BREAST CYST EXCISION Left    neg   BREAST CYST EXCISION Left 02/28/2022   Procedure: ABSCESS EXCISION BREAST, recurrent abscess;  Surgeon: Henrene Dodge, MD;  Location: [facility];  Service: General;  Laterality: Left;   COLONOSCOPY WITH PROPOFOL N/A 01/26/2022   Procedure: COLONOSCOPY WITH PROPOFOL;  Surgeon: Midge Minium, MD;  Location: [facility];  Service: Endoscopy;  Laterality: N/A;  FOOT SURGERY Bilateral 2010   bunions;hammertoe   KNEE ARTHROSCOPY Left 04/12/2017   Procedure: ARTHROSCOPY KNEE with chonadroplasty with partial menisectomy;  Surgeon: Erin Sons, MD;  Location: [facility];  Service: Orthopedics;  Laterality: Left;    Social History   Tobacco Use   Smoking status:  Former    Current packs/day: 0.00    Types: Cigarettes    Quit date: 01/31/2010    Years since quitting: 13.4   Smokeless tobacco: Never  Vaping Use   Vaping status: Never Used  Substance Use Topics   Alcohol use: Not Currently    Comment: occasssional   Drug use: No     Medication list has been reviewed and updated.  Current Meds  Medication Sig   acetaminophen (TYLENOL) 500 MG tablet Take 1,000 mg by mouth daily as needed for moderate pain.   hydrochlorothiazide (HYDRODIURIL) 12.5 MG tablet TAKE 1 TABLET BY MOUTH EVERY DAY   ibuprofen (ADVIL) 800 MG tablet Take 1 tablet (800 mg total) by mouth every 8 (eight) hours as needed.   sertraline (ZOLOFT) 50 MG tablet Take 1 tablet (50 mg total) by mouth daily. Initiate at one half tabley a day for 10 day then one a day.       06/25/2023    2:37 PM 03/22/2023   10:15 AM 02/28/2023   10:01 AM 08/03/2022    3:06 PM  GAD 7 : Generalized Anxiety Score  Nervous, Anxious, on Edge 1 0 0 0  Control/stop worrying 0 0 0 0  Worry too much - different things 0 0 0 0  Trouble relaxing 1 0 0 0  Restless 0 0 0 0  Easily annoyed or irritable 0 2 2 1   Afraid - awful might happen 0 1 0 1  Total GAD 7 Score 2 3 2 2   Anxiety Difficulty Not difficult at all Not difficult at all Not difficult at all Not difficult at all       06/25/2023    2:37 PM 03/22/2023   10:14 AM 02/28/2023   10:00 AM  Depression screen PHQ 2/9  Decreased Interest 1 0 0  Down, Depressed, Hopeless 1 0 2  PHQ - 2 Score 2 0 2  Altered sleeping 2 1 3   Tired, decreased energy 0 0 1  Change in appetite 0 1 1  Feeling bad or failure about yourself  0 0 0  Trouble concentrating 0 1 0  Moving slowly or fidgety/restless 0 0 1  Suicidal thoughts 0 0 0  PHQ-9 Score 4 3 8   Difficult doing work/chores Not difficult at all Not difficult at all Very difficult    BP Readings from Last 3 Encounters:  06/25/23 128/82  03/22/23 120/76  02/28/23 120/78    Physical Exam Vitals and  nursing note reviewed. Exam conducted with a chaperone present.  Constitutional:      General: She is not in acute distress.    Appearance: She is not diaphoretic.  HENT:     Head: Normocephalic and atraumatic.     Right Ear: Tympanic membrane and external ear normal.     Left Ear: Tympanic membrane and external ear normal.     Nose: Nose normal. No congestion or rhinorrhea.     Mouth/Throat:     Pharynx: Oropharynx is clear.  Eyes:     General:        Right eye: No discharge.        Left eye: No discharge.  Conjunctiva/sclera: Conjunctivae normal.     Pupils: Pupils are equal, round, and reactive to light.  Neck:     Thyroid: No thyromegaly.     Vascular: No JVD.  Cardiovascular:     Rate and Rhythm: Normal rate and regular rhythm.     Heart sounds: Normal heart sounds. No murmur heard.    No friction rub. No gallop.  Pulmonary:     Effort: Pulmonary effort is normal.     Breath sounds: Normal breath sounds. No wheezing, rhonchi or rales.  Chest:     Chest wall: No tenderness.  Abdominal:     General: Bowel sounds are normal.     Palpations: Abdomen is soft. There is no mass.     Tenderness: There is no abdominal tenderness. There is no guarding.  Musculoskeletal:        General: Normal range of motion.     Cervical back: Normal range of motion and neck supple.  Lymphadenopathy:     Cervical: No cervical adenopathy.  Skin:    General: Skin is warm and dry.  Neurological:     Mental Status: She is alert.     Deep Tendon Reflexes: Reflexes are normal and symmetric.     Wt Readings from Last 3 Encounters:  06/25/23 181 lb (82.1 kg)  03/22/23 189 lb (85.7 kg)  02/28/23 194 lb (88 kg)    BP 128/82   Pulse 88   Ht 5\' 5"  (1.651 m)   Wt 181 lb (82.1 kg)   SpO2 98%   BMI 30.12 kg/m   Assessment and Plan:  1. Primary hypertension Chronic.  Controlled.  Stable.  Blood pressure 128/82.  Asymptomatic.  Tolerating medication well.  Continue hydrochlorothiazide  12.5 mg once a day.  Review of previous labs acceptable at this time.  Will recheck patient in 6 months. - hydrochlorothiazide (HYDRODIURIL) 12.5 MG tablet; Take 1 tablet (12.5 mg total) by mouth daily.  Dispense: 90 tablet; Refill: 1  2. Reactive depression Chronic.  Controlled.  Stable.  PHQ is 4 GAD score is 2.  Patient tolerating current medication dose of sertraline 50 mg once a day.  Will recheck patient in 6 months. - sertraline (ZOLOFT) 50 MG tablet; Take 1 tablet (50 mg total) by mouth daily. Initiate at one half tabley a day for 10 day then one a day.  Dispense: 90 tablet; Refill: 1  3. Need for influenza vaccination Discussed and administered - Flu vaccine trivalent PF, 6mos and older(Flulaval,Afluria,Fluarix,Fluzone)    Elizabeth Sauer, MD

## 2023-08-20 ENCOUNTER — Ambulatory Visit: Payer: Self-pay | Admitting: *Deleted

## 2023-08-20 NOTE — Telephone Encounter (Signed)
  Chief Complaint: recent death of father and worsening depression and unable to sleep. Requesting medication  Symptoms: trouble sleeping, not able to concentrate. sadness Frequency: death of daughter in December 31, 2022 and now death of father Pertinent Negatives: Patient denies chest pain no difficulty breathing no thoughts to hurt self or others Disposition: [] ED /[] Urgent Care (no appt availability in office) / [] Appointment(In office/virtual)/ []  [facility]/ [] Home Care/ [] Refused Recommended Disposition /[]  Mobile Bus/ [x]  Follow-up with PCP Additional Notes:   Offered appt . Patient declined and reports she is due to go out of town tomorrow for funeral and requesting if any medication can be prescribed prior to going out of town. Requesting PCP to complete FMLA paperwork needed for work while out for funeral and managing depression due to loss. Please advise.          Reason for Disposition  [1] Depression AND [2] worsening (e.g., sleeping poorly, less able to do activities of daily living)  Answer Assessment - Initial Assessment Questions 1. CONCERN: "What happened that made you call today?"     Father passed away and not coping well. 2. DEPRESSION SYMPTOM SCREENING: "How are you feeling overall?" (e.g., decreased energy, increased sleeping or difficulty sleeping, difficulty concentrating, feelings of sadness, guilt, hopelessness, or worthlessness)     Decreased energy, no sleeping feelings of sadness 3. RISK OF HARM - SUICIDAL IDEATION:  "Do you ever have thoughts of hurting or killing yourself?"  (e.g., yes, no, no but preoccupation with thoughts about death)   - INTENT:  "Do you have thoughts of hurting or killing yourself right NOW?" (e.g., yes, no, N/A)   - PLAN: "Do you have a specific plan for how you would do this?" (e.g., gun, knife, overdose, no plan, N/A)     no 4. RISK OF HARM - HOMICIDAL IDEATION:  "Do you ever have thoughts of hurting or killing someone  else?"  (e.g., yes, no, no but preoccupation with thoughts about death)   - INTENT:  "Do you have thoughts of hurting or killing someone right NOW?" (e.g., yes, no, N/A)   - PLAN: "Do you have a specific plan for how you would do this?" (e.g., gun, knife, no plan, N/A)      no 5. FUNCTIONAL IMPAIRMENT: "How have things been going for you overall? Have you had more difficulty than usual doing your normal daily activities?"  (e.g., better, same, worse; self-care, school, work, interactions)     Sleeping is an issues 6. SUPPORT: "Who is with you now?" "Who do you live with?" "Do you have family or friends who you can talk to?"      Support of fiancee 7. THERAPIST: "Do you have a counselor or therapist? Name?"     na 8. STRESSORS: "Has there been any new stress or recent changes in your life?"     Yes death of daughter and now father  105. ALCOHOL USE OR SUBSTANCE USE (DRUG USE): "Do you drink alcohol or use any illegal drugs?"     na 10. OTHER: "Do you have any other physical symptoms right now?" (e.g., fever)       No  11. PREGNANCY: "Is there any chance you are pregnant?" "When was your last menstrual period?"       Na  Protocols used: Depression-A-AH

## 2023-08-21 NOTE — Telephone Encounter (Signed)
Left voice mail to set up appointment with any provider this week for medications (Dr Yetta Barre is out this week)

## 2023-08-21 NOTE — Telephone Encounter (Signed)
Please schedule an appt for pt. She needs to be seen before any medications can be sent in.  KP

## 2023-09-03 ENCOUNTER — Telehealth: Payer: Self-pay | Admitting: Family Medicine

## 2023-09-03 NOTE — Telephone Encounter (Signed)
Spoke with patient- she said her father and daughter both passed away this month and she is needing to discuss her depression/ anxiety and possibly get a work note to take a leave of absence. Told patient she would have to be seen in order to discuss these things with Dr Yetta Barre.  She said she will be back from [place] in January so she will call back and make an appointment then to discuss.  - Neco Kling

## 2023-09-03 NOTE — Telephone Encounter (Signed)
Pt is calling in because she is requesting that Dr. Yetta Barre' nurse give her a call as soon as possible. Pt did not state what it was regarding.

## 2023-09-19 ENCOUNTER — Encounter: Payer: Self-pay | Admitting: Family Medicine

## 2023-09-19 ENCOUNTER — Ambulatory Visit (INDEPENDENT_AMBULATORY_CARE_PROVIDER_SITE_OTHER): Payer: BC Managed Care – PPO | Admitting: Family Medicine

## 2023-09-19 DIAGNOSIS — I10 Essential (primary) hypertension: Secondary | ICD-10-CM

## 2023-09-19 DIAGNOSIS — F329 Major depressive disorder, single episode, unspecified: Secondary | ICD-10-CM

## 2023-09-19 MED ORDER — SERTRALINE HCL 50 MG PO TABS
50.0000 mg | ORAL_TABLET | Freq: Every day | ORAL | 1 refills | Status: DC
Start: 2023-09-19 — End: 2023-09-19

## 2023-09-19 MED ORDER — HYDROCHLOROTHIAZIDE 12.5 MG PO TABS
12.5000 mg | ORAL_TABLET | Freq: Every day | ORAL | 1 refills | Status: DC
Start: 2023-09-19 — End: 2023-11-01

## 2023-09-19 MED ORDER — SERTRALINE HCL 50 MG PO TABS
75.0000 mg | ORAL_TABLET | Freq: Every day | ORAL | 1 refills | Status: DC
Start: 2023-09-19 — End: 2023-11-01

## 2023-09-19 NOTE — Progress Notes (Signed)
 Date:  09/19/2023   Name:  Angela French   DOB:  23-Jan-1971   MRN:  [id number]   Chief Complaint: Depression and Hypertension  hy  Hypertension This is a chronic problem. The problem has been gradually improving since onset. Associated symptoms include anxiety. Pertinent negatives include no blurred vision, chest pain, headaches, malaise/fatigue, neck pain, orthopnea, palpitations, peripheral edema, PND, shortness of breath or sweats. Past treatments include ACE inhibitors. The current treatment provides moderate improvement. There are no compliance problems.  There is no history of CAD/MI or CVA. There is no history of chronic renal disease, a hypertension causing med or renovascular disease.  Depression      The patient presents with depression.  This is a chronic problem.  The current episode started more than 1 month ago.   The problem occurs daily.  The problem has been gradually improving since onset.  Associated symptoms include helplessness, hopelessness, insomnia, restlessness, decreased interest and sad.  Associated symptoms include no decreased concentration, no fatigue, not irritable, no appetite change, no body aches, no myalgias, no headaches, no indigestion and no suicidal ideas.  Past treatments include SSRIs - Selective serotonin reuptake inhibitors.  Compliance with treatment is variable.  Past medical history includes chronic illness, anxiety and depression.   Anxiety Presents for follow-up visit. Symptoms include depressed mood, excessive worry, insomnia, nervous/anxious behavior and restlessness. Patient reports no chest pain, decreased concentration, palpitations, shortness of breath or suicidal ideas. The severity of symptoms is mild. The quality of sleep is fair.   Her past medical history is significant for depression.    Lab Results  Component Value Date   NA 142 08/16/2022   K 4.1 08/16/2022   CO2 25 08/16/2022   GLUCOSE 85 08/16/2022   BUN 10 08/16/2022    CREATININE 0.86 08/16/2022   CALCIUM 9.4 08/16/2022   EGFR 83 08/16/2022   GFRNONAA 87 05/12/2020   Lab Results  Component Value Date   CHOL 106 01/04/2022   HDL 54 01/04/2022   LDLCALC 34 01/04/2022   TRIG 95 01/04/2022   CHOLHDL 1.9 06/24/2019   No results found for: TSH No results found for: HGBA1C Lab Results  Component Value Date   WBC 6.1 02/28/2022   HGB 11.2 (L) 02/28/2022   HCT 36.3 02/28/2022   MCV 80.0 02/28/2022   PLT 581 (H) 02/28/2022   Lab Results  Component Value Date   ALT 15 02/07/2017   AST 20 02/07/2017   ALKPHOS 65 02/07/2017   BILITOT 0.5 02/07/2017   No results found for: 25OHVITD2, 25OHVITD3, VD25OH   Review of Systems  Constitutional:  Negative for appetite change, fatigue and malaise/fatigue.  Eyes:  Negative for blurred vision and visual disturbance.  Respiratory:  Negative for chest tightness, shortness of breath, wheezing and stridor.   Cardiovascular:  Negative for chest pain, palpitations, orthopnea, leg swelling and PND.  Gastrointestinal:  Negative for abdominal distention and abdominal pain.  Endocrine: Negative for polydipsia and polyuria.  Genitourinary:  Negative for difficulty urinating.  Musculoskeletal:  Negative for myalgias and neck pain.  Neurological:  Negative for headaches.  Psychiatric/Behavioral:  Positive for depression. Negative for decreased concentration and suicidal ideas. The patient is nervous/anxious and has insomnia.     Patient Active Problem List   Diagnosis Date Noted   Uterine leiomyoma 12/27/2022   Left breast abscess    Encounter for screening colonoscopy    Obesity 07/26/2017   Status post arthroscopy of left knee 07/26/2017  Primary osteoarthritis of left knee 03/29/2017    No Known Allergies  Past Surgical History:  Procedure Laterality Date   BREAST BIOPSY Left 04/22/2018   US  Aspiration, US  biopsy and Lymph node biopsy   BREAST CYST EXCISION Left    neg   BREAST CYST EXCISION  Left 02/28/2022   Procedure: ABSCESS EXCISION BREAST, recurrent abscess;  Surgeon: Desiderio Schanz, MD;  Location: [facility];  Service: General;  Laterality: Left;   COLONOSCOPY WITH PROPOFOL  N/A 01/26/2022   Procedure: COLONOSCOPY WITH PROPOFOL ;  Surgeon: Jinny Carmine, MD;  Location: [facility];  Service: Endoscopy;  Laterality: N/A;   FOOT SURGERY Bilateral 2010   bunions;hammertoe   KNEE ARTHROSCOPY Left 04/12/2017   Procedure: ARTHROSCOPY KNEE with chonadroplasty with partial menisectomy;  Surgeon: Maryl Barters, MD;  Location: [facility];  Service: Orthopedics;  Laterality: Left;    Social History   Tobacco Use   Smoking status: Former    Current packs/day: 0.00    Types: Cigarettes    Quit date: 01/31/2010    Years since quitting: 13.6   Smokeless tobacco: Never  Vaping Use   Vaping status: Never Used  Substance Use Topics   Alcohol use: Not Currently    Comment: occasssional   Drug use: No     Medication list has been reviewed and updated.  Current Meds  Medication Sig   acetaminophen  (TYLENOL ) 500 MG tablet Take 1,000 mg by mouth daily as needed for moderate pain.   hydrochlorothiazide  (HYDRODIURIL ) 12.5 MG tablet Take 1 tablet (12.5 mg total) by mouth daily.   ibuprofen  (ADVIL ) 800 MG tablet Take 1 tablet (800 mg total) by mouth every 8 (eight) hours as needed.   sertraline  (ZOLOFT ) 50 MG tablet Take 1 tablet (50 mg total) by mouth daily. Initiate at one half tabley a day for 10 day then one a day.       09/19/2023    3:26 PM 06/25/2023    2:37 PM 03/22/2023   10:15 AM 02/28/2023   10:01 AM  GAD 7 : Generalized Anxiety Score  Nervous, Anxious, on Edge 1 1 0 0  Control/stop worrying 3 0 0 0  Worry too much - different things 2 0 0 0  Trouble relaxing 3 1 0 0  Restless 2 0 0 0  Easily annoyed or irritable 2 0 2 2  Afraid - awful might happen 2 0 1 0  Total GAD 7 Score 15 2 3 2   Anxiety Difficulty Somewhat difficult Not difficult at all Not  difficult at all Not difficult at all       09/19/2023    3:25 PM 06/25/2023    2:37 PM 03/22/2023   10:14 AM  Depression screen PHQ 2/9  Decreased Interest 3 1 0  Down, Depressed, Hopeless 3 1 0  PHQ - 2 Score 6 2 0  Altered sleeping 3 2 1   Tired, decreased energy 2 0 0  Change in appetite 3 0 1  Feeling bad or failure about yourself  0 0 0  Trouble concentrating 3 0 1  Moving slowly or fidgety/restless 0 0 0  Suicidal thoughts 0 0 0  PHQ-9 Score 17 4 3   Difficult doing work/chores Somewhat difficult Not difficult at all Not difficult at all    BP Readings from Last 3 Encounters:  09/19/23 122/70  06/25/23 128/82  03/22/23 120/76    Physical Exam Vitals and nursing note reviewed.  Constitutional:      General: She is  not irritable.    Appearance: She is well-developed.  HENT:     Head: Normocephalic.     Right Ear: Tympanic membrane and external ear normal.     Left Ear: Tympanic membrane and external ear normal.  Eyes:     General: Lids are everted, no foreign bodies appreciated. No scleral icterus.       Left eye: No foreign body or hordeolum.     Conjunctiva/sclera: Conjunctivae normal.     Right eye: Right conjunctiva is not injected.     Left eye: Left conjunctiva is not injected.     Pupils: Pupils are equal, round, and reactive to light.  Neck:     Thyroid: No thyromegaly.     Vascular: No JVD.     Trachea: No tracheal deviation.  Cardiovascular:     Rate and Rhythm: Normal rate and regular rhythm.     Heart sounds: Normal heart sounds. No murmur heard.    No friction rub. No gallop.  Pulmonary:     Effort: Pulmonary effort is normal. No respiratory distress.     Breath sounds: Normal breath sounds. No wheezing or rales.  Abdominal:     General: Bowel sounds are normal.     Palpations: Abdomen is soft. There is no mass.     Tenderness: There is no abdominal tenderness. There is no guarding or rebound.  Musculoskeletal:        General: No tenderness.  Normal range of motion.     Cervical back: Normal range of motion and neck supple.  Lymphadenopathy:     Cervical: No cervical adenopathy.  Skin:    General: Skin is warm.     Findings: No rash.  Neurological:     Mental Status: She is alert and oriented to person, place, and time.     Cranial Nerves: No cranial nerve deficit.     Deep Tendon Reflexes: Reflexes normal.  Psychiatric:        Mood and Affect: Mood is not anxious or depressed.     Wt Readings from Last 3 Encounters:  09/19/23 177 lb 6.4 oz (80.5 kg)  06/25/23 181 lb (82.1 kg)  03/22/23 189 lb (85.7 kg)    BP 122/70   Pulse 78   Ht 5' 5 (1.651 m)   Wt 177 lb 6.4 oz (80.5 kg)   SpO2 100%   BMI 29.52 kg/m   Assessment and Plan: 1. Primary hypertension Chronic.  Controlled.  Stable.  Blood pressure 122/70.  Asymptomatic.  Tolerating medication well.  Will check hydrochlorothiazide  12.5 mg once a day.  Patient will return fasting in 6 weeks at which time we will do renal function panel for electrolytes and GFR.  Will - hydrochlorothiazide  (HYDRODIURIL ) 12.5 MG tablet; Take 1 tablet (12.5 mg total) by mouth daily.  Dispense: 90 tablet; Refill: 1  2. Reactive depression Chronic.  Uncontrolled.  Relatively stable.  With no suicidal considerations.  PHQ is 17.  Patient recently had the passing of her father on December 3 that she went down on the third and he passed away by the fourth and stay down there and was wondering if she can get disability for stay in in [place]  for the month following and I told her this would be unlikely.  To qualify for disability and we will improve her dosing of sertraline  to 75 mg and will recheck patient in 6 weeks. - sertraline  (ZOLOFT ) 50 MG tablet; Take 1.5 tablets (75 mg total) by mouth daily.  Initiate at one half tabley a day for 10 day then one a day.  Dispense: 135 tablet; Refill: 1   3.  Anxiety chronic.  Uncontrolled.  Relatively stable with GAD score of 17.  Again patient is on  50 mg of Zoloft  and we will increase the Zoloft  to 75 mg once a day and will recheck patient in 6 weeks.  Cathryne Molt, MD

## 2023-10-09 ENCOUNTER — Telehealth: Payer: Self-pay | Admitting: Family Medicine

## 2023-10-09 NOTE — Telephone Encounter (Signed)
Copied from CRM [id number]. Topic: General - Other >> Oct 09, 2023 10:03 AM Everette C wrote: Reason for CRM: The patient has called for an update on their FMLA paperwork relating to behavioral health   Please contact the patient further when possible

## 2023-10-10 NOTE — Telephone Encounter (Signed)
Called patient and left VM. Spoke with Dr Yetta Barre and she said she cannot fill out FMLA forms for this. She said she did not take patient out of work and cannot complete these forms.   - Ambika Zettlemoyer

## 2023-11-01 ENCOUNTER — Encounter: Payer: Self-pay | Admitting: Family Medicine

## 2023-11-01 ENCOUNTER — Ambulatory Visit: Payer: BC Managed Care – PPO | Admitting: Family Medicine

## 2023-11-01 VITALS — BP 110/86 | HR 81 | Ht 65.0 in | Wt 179.0 lb

## 2023-11-01 DIAGNOSIS — F329 Major depressive disorder, single episode, unspecified: Secondary | ICD-10-CM

## 2023-11-01 DIAGNOSIS — I10 Essential (primary) hypertension: Secondary | ICD-10-CM | POA: Diagnosis not present

## 2023-11-01 DIAGNOSIS — F419 Anxiety disorder, unspecified: Secondary | ICD-10-CM | POA: Diagnosis not present

## 2023-11-01 MED ORDER — HYDROCHLOROTHIAZIDE 12.5 MG PO TABS
12.5000 mg | ORAL_TABLET | Freq: Every day | ORAL | 1 refills | Status: DC
Start: 2023-11-01 — End: 2023-12-13

## 2023-11-01 MED ORDER — SERTRALINE HCL 50 MG PO TABS: ORAL_TABLET | ORAL | 1 refills | Status: DC

## 2023-11-01 NOTE — Progress Notes (Signed)
Date:  11/01/2023   Name:  Angela French   DOB:  1970-12-16   MRN:  [id number]   Chief Complaint: Hypertension and anxiety and depression   Hypertension This is a chronic problem. The current episode started more than 1 year ago. The problem has been gradually improving since onset. The problem is controlled. Associated symptoms include anxiety. Pertinent negatives include no blurred vision, chest pain, headaches, malaise/fatigue, neck pain, orthopnea, palpitations, peripheral edema, PND, shortness of breath or sweats. There are no associated agents to hypertension. Past treatments include diuretics. The current treatment provides moderate improvement. There are no compliance problems.  There is no history of CAD/MI or CVA. There is no history of chronic renal disease, a hypertension causing med or renovascular disease.  Depression        This is a chronic problem.  The onset quality is gradual.   The problem occurs intermittently.  The problem has been gradually improving since onset.  Associated symptoms include decreased concentration, fatigue, helplessness, hopelessness, insomnia, irritable, restlessness, indigestion and sad.  Associated symptoms include no decreased interest, no headaches and no suicidal ideas.  Past treatments include SSRIs - Selective serotonin reuptake inhibitors.  Compliance with treatment is good.  Past medical history includes anxiety.   Anxiety Presents for follow-up visit. Symptoms include decreased concentration, excessive worry, insomnia, irritability, nervous/anxious behavior and restlessness. Patient reports no chest pain, feeling of choking, palpitations, shortness of breath or suicidal ideas. Symptoms occur occasionally.      Lab Results  Component Value Date   NA 142 08/16/2022   K 4.1 08/16/2022   CO2 25 08/16/2022   GLUCOSE 85 08/16/2022   BUN 10 08/16/2022   CREATININE 0.86 08/16/2022   CALCIUM 9.4 08/16/2022   EGFR 83 08/16/2022   GFRNONAA 87  05/12/2020   Lab Results  Component Value Date   CHOL 106 01/04/2022   HDL 54 01/04/2022   LDLCALC 34 01/04/2022   TRIG 95 01/04/2022   CHOLHDL 1.9 06/24/2019   No results found for: "TSH" No results found for: "HGBA1C" Lab Results  Component Value Date   WBC 6.1 02/28/2022   HGB 11.2 (L) 02/28/2022   HCT 36.3 02/28/2022   MCV 80.0 02/28/2022   PLT 581 (H) 02/28/2022   Lab Results  Component Value Date   ALT 15 02/07/2017   AST 20 02/07/2017   ALKPHOS 65 02/07/2017   BILITOT 0.5 02/07/2017   No results found for: "25OHVITD2", "25OHVITD3", "VD25OH"   Review of Systems  Constitutional:  Positive for fatigue and irritability. Negative for malaise/fatigue and unexpected weight change.  Eyes:  Negative for blurred vision and visual disturbance.  Respiratory:  Negative for choking, chest tightness, shortness of breath and wheezing.   Cardiovascular:  Negative for chest pain, palpitations, orthopnea and PND.  Gastrointestinal:  Negative for blood in stool, constipation and diarrhea.  Endocrine: Negative for polydipsia and polyuria.  Genitourinary:  Negative for difficulty urinating, hematuria and vaginal bleeding.  Musculoskeletal:  Negative for neck pain.  Neurological:  Negative for headaches.  Psychiatric/Behavioral:  Positive for decreased concentration and depression. Negative for suicidal ideas. The patient is nervous/anxious and has insomnia.     Patient Active Problem List   Diagnosis Date Noted   Uterine leiomyoma 12/27/2022   Left breast abscess    Encounter for screening colonoscopy    Obesity 07/26/2017   Status post arthroscopy of left knee 07/26/2017   Primary osteoarthritis of left knee 03/29/2017    No Known  Allergies  Past Surgical History:  Procedure Laterality Date   BREAST BIOPSY Left 04/22/2018   US Aspiration, US biopsy and Lymph node biopsy   BREAST CYST EXCISION Left    neg   BREAST CYST EXCISION Left 02/28/2022   Procedure: ABSCESS  EXCISION BREAST, recurrent abscess;  Surgeon: Henrene Dodge, MD;  Location: [facility];  Service: General;  Laterality: Left;   COLONOSCOPY WITH PROPOFOL N/A 01/26/2022   Procedure: COLONOSCOPY WITH PROPOFOL;  Surgeon: Midge Minium, MD;  Location: [facility];  Service: Endoscopy;  Laterality: N/A;   FOOT SURGERY Bilateral 2010   bunions;hammertoe   KNEE ARTHROSCOPY Left 04/12/2017   Procedure: ARTHROSCOPY KNEE with chonadroplasty with partial menisectomy;  Surgeon: Erin Sons, MD;  Location: [facility];  Service: Orthopedics;  Laterality: Left;    Social History   Tobacco Use   Smoking status: Former    Current packs/day: 0.00    Types: Cigarettes    Quit date: 01/31/2010    Years since quitting: 13.7   Smokeless tobacco: Never  Vaping Use   Vaping status: Never Used  Substance Use Topics   Alcohol use: Not Currently    Comment: occasssional   Drug use: No     Medication list has been reviewed and updated.  Current Meds  Medication Sig   acetaminophen (TYLENOL) 500 MG tablet Take 1,000 mg by mouth daily as needed for moderate pain.   hydrochlorothiazide (HYDRODIURIL) 12.5 MG tablet Take 1 tablet (12.5 mg total) by mouth daily.   ibuprofen (ADVIL) 800 MG tablet Take 1 tablet (800 mg total) by mouth every 8 (eight) hours as needed.   sertraline (ZOLOFT) 50 MG tablet Take 1.5 tablets (75 mg total) by mouth daily. Initiate at one half tabley a day for 10 day then one a day.       11/01/2023    3:26 PM 09/19/2023    3:26 PM 06/25/2023    2:37 PM 03/22/2023   10:15 AM  GAD 7 : Generalized Anxiety Score  Nervous, Anxious, on Edge 1 1 1  0  Control/stop worrying 2 3 0 0  Worry too much - different things 2 2 0 0  Trouble relaxing 2 3 1  0  Restless 2 2 0 0  Easily annoyed or irritable 2 2 0 2  Afraid - awful might happen 3 2 0 1  Total GAD 7 Score 14 15 2 3   Anxiety Difficulty Somewhat difficult Somewhat difficult Not difficult at all Not difficult at all        11/01/2023    3:25 PM 09/19/2023    3:25 PM 06/25/2023    2:37 PM  Depression screen PHQ 2/9  Decreased Interest 0 3 1  Down, Depressed, Hopeless 3 3 1   PHQ - 2 Score 3 6 2   Altered sleeping 2 3 2   Tired, decreased energy 2 2 0  Change in appetite 2 3 0  Feeling bad or failure about yourself  1 0 0  Trouble concentrating 2 3 0  Moving slowly or fidgety/restless 2 0 0  Suicidal thoughts 0 0 0  PHQ-9 Score 14 17 4   Difficult doing work/chores Somewhat difficult Somewhat difficult Not difficult at all    BP Readings from Last 3 Encounters:  11/01/23 110/86  09/19/23 122/70  06/25/23 128/82    Physical Exam Vitals and nursing note reviewed.  Constitutional:      General: She is irritable. She is not in acute distress.    Appearance: She is  not diaphoretic.  HENT:     Head: Normocephalic and atraumatic.     Right Ear: Tympanic membrane, ear canal and external ear normal.     Left Ear: Tympanic membrane, ear canal and external ear normal.     Nose: Nose normal. No congestion or rhinorrhea.     Mouth/Throat:     Mouth: Mucous membranes are moist.  Eyes:     General:        Right eye: No discharge.        Left eye: No discharge.     Conjunctiva/sclera: Conjunctivae normal.     Pupils: Pupils are equal, round, and reactive to light.  Neck:     Thyroid: No thyromegaly.     Vascular: No JVD.  Cardiovascular:     Rate and Rhythm: Normal rate and regular rhythm.     Heart sounds: Normal heart sounds. No murmur heard.    No friction rub. No gallop.  Pulmonary:     Effort: Pulmonary effort is normal.     Breath sounds: Normal breath sounds. No wheezing, rhonchi or rales.  Abdominal:     General: Bowel sounds are normal.     Palpations: Abdomen is soft. There is no mass.     Tenderness: There is no abdominal tenderness. There is no guarding.  Musculoskeletal:        General: Normal range of motion.     Cervical back: Normal range of motion and neck supple.   Lymphadenopathy:     Cervical: No cervical adenopathy.  Skin:    General: Skin is warm and dry.  Neurological:     Mental Status: She is alert.     Deep Tendon Reflexes: Reflexes are normal and symmetric.     Wt Readings from Last 3 Encounters:  11/01/23 179 lb (81.2 kg)  09/19/23 177 lb 6.4 oz (80.5 kg)  06/25/23 181 lb (82.1 kg)    BP 110/86   Pulse 81   Ht 5\' 5"  (1.651 m)   Wt 179 lb (81.2 kg)   SpO2 98%   BMI 29.79 kg/m   Assessment and Plan:  1. Primary hypertension (Primary) Chronic.  Controlled.  Stable.  Blood pressure today is 110/86.  Asymptomatic.  Tolerating medication well.  Continue hydrochlorothiazide 12.5 mg once a day.  Review of renal function panel from end of November was acceptable and we will use those numbers at this time and will recheck in 6 months. - hydrochlorothiazide (HYDRODIURIL) 12.5 MG tablet; Take 1 tablet (12.5 mg total) by mouth daily.  Dispense: 90 tablet; Refill: 1  2. Reactive depression Chronic.  Partially controlled and that PHQ went from 17-14 on dosing of Zoloft 50 mg once a day.  With no self-harm thoughts we will increase to 75 mg which is 1 and half tablets at night suggested that she take a whole 1 at night and a half 1 in the morning.  Will recheck in 6 months. - sertraline (ZOLOFT) 50 MG tablet; Take one and a half tablet a day.( Take 1 tablet at night and one half tablet in morning)  Dispense: 135 tablet; Refill: 1  3. Anxiety Chronic.  Partially controlled.  However stable with no suicidal thoughts.  Not much change went from 15-14 on 50 mg Zoloft so we will increase Zoloft to 75 mg and will recheck in 6 months. - sertraline (ZOLOFT) 50 MG tablet; Take one and a half tablet a day.( Take 1 tablet at night and one half tablet  in morning)  Dispense: 135 tablet; Refill: 1    Elizabeth Sauer, MD

## 2023-12-05 ENCOUNTER — Ambulatory Visit: Payer: Self-pay | Admitting: Family Medicine

## 2023-12-13 ENCOUNTER — Encounter: Payer: Self-pay | Admitting: Family Medicine

## 2023-12-13 ENCOUNTER — Ambulatory Visit (INDEPENDENT_AMBULATORY_CARE_PROVIDER_SITE_OTHER): Admitting: Family Medicine

## 2023-12-13 DIAGNOSIS — M1711 Unilateral primary osteoarthritis, right knee: Secondary | ICD-10-CM | POA: Diagnosis not present

## 2023-12-13 DIAGNOSIS — I10 Essential (primary) hypertension: Secondary | ICD-10-CM | POA: Diagnosis not present

## 2023-12-13 DIAGNOSIS — F419 Anxiety disorder, unspecified: Secondary | ICD-10-CM | POA: Diagnosis not present

## 2023-12-13 DIAGNOSIS — F329 Major depressive disorder, single episode, unspecified: Secondary | ICD-10-CM | POA: Diagnosis not present

## 2023-12-13 MED ORDER — SERTRALINE HCL 50 MG PO TABS: ORAL_TABLET | ORAL | 1 refills | Status: DC

## 2023-12-13 MED ORDER — HYDROCHLOROTHIAZIDE 12.5 MG PO TABS: 12.5000 mg | ORAL_TABLET | Freq: Every day | ORAL | 1 refills | Status: DC

## 2023-12-13 MED ORDER — IBUPROFEN 800 MG PO TABS
800.0000 mg | ORAL_TABLET | Freq: Every day | ORAL | 5 refills | Status: DC | PRN
Start: 2023-12-13 — End: 2023-12-13

## 2023-12-13 MED ORDER — IBUPROFEN 800 MG PO TABS
800.0000 mg | ORAL_TABLET | Freq: Every day | ORAL | 5 refills | Status: AC | PRN
Start: 2023-12-13 — End: ?

## 2023-12-13 MED ORDER — SERTRALINE HCL 100 MG PO TABS: 100.0000 mg | ORAL_TABLET | Freq: Every day | ORAL | 3 refills | Status: DC

## 2023-12-13 NOTE — Progress Notes (Signed)
 Date:  12/13/2023   Name:  Angela French   DOB:  1971-01-11   MRN:  [id number]   Chief Complaint: Hypertension (/) and Depression  Hypertension This is a chronic problem. The current episode started more than 1 year ago. The problem has been gradually improving since onset. The problem is controlled. Pertinent negatives include no anxiety, blurred vision, chest pain, headaches, malaise/fatigue, neck pain, orthopnea, palpitations, peripheral edema, PND, shortness of breath or sweats. There are no associated agents to hypertension. Risk factors for coronary artery disease include dyslipidemia.  Depression        This is a chronic problem.  The current episode started more than 1 year ago.   The onset quality is gradual.   The problem has been gradually improving since onset.  Associated symptoms include no decreased concentration, no fatigue, no helplessness, no hopelessness, does not have insomnia, not irritable, no restlessness, no myalgias, no headaches, not sad and no suicidal ideas.     The symptoms are aggravated by nothing.  Past treatments include SSRIs - Selective serotonin reuptake inhibitors.  Compliance with treatment is good.  Previous treatment provided moderate relief.   Pertinent negatives include no anxiety. Hyperlipidemia This is a chronic problem. The current episode started more than 1 year ago. The problem is controlled. Recent lipid tests were reviewed and are normal. Pertinent negatives include no chest pain, myalgias or shortness of breath. Current antihyperlipidemic treatment includes diet change. The current treatment provides moderate improvement of lipids.    Lab Results  Component Value Date   NA 142 08/16/2022   K 4.1 08/16/2022   CO2 25 08/16/2022   GLUCOSE 85 08/16/2022   BUN 10 08/16/2022   CREATININE 0.86 08/16/2022   CALCIUM 9.4 08/16/2022   EGFR 83 08/16/2022   GFRNONAA 87 05/12/2020   Lab Results  Component Value Date   CHOL 106 01/04/2022   HDL  54 01/04/2022   LDLCALC 34 01/04/2022   TRIG 95 01/04/2022   CHOLHDL 1.9 06/24/2019   No results found for: "TSH" No results found for: "HGBA1C" Lab Results  Component Value Date   WBC 6.1 02/28/2022   HGB 11.2 (L) 02/28/2022   HCT 36.3 02/28/2022   MCV 80.0 02/28/2022   PLT 581 (H) 02/28/2022   Lab Results  Component Value Date   ALT 15 02/07/2017   AST 20 02/07/2017   ALKPHOS 65 02/07/2017   BILITOT 0.5 02/07/2017   No results found for: "25OHVITD2", "25OHVITD3", "VD25OH"   Review of Systems  Constitutional: Negative.  Negative for chills, fatigue, fever, malaise/fatigue and unexpected weight change.  HENT:  Negative for congestion, ear discharge, ear pain, rhinorrhea, sinus pressure, sneezing and sore throat.   Eyes:  Negative for blurred vision.  Respiratory:  Negative for cough, shortness of breath, wheezing and stridor.   Cardiovascular:  Negative for chest pain, palpitations, orthopnea and PND.  Gastrointestinal:  Negative for abdominal pain, blood in stool, constipation, diarrhea and nausea.  Genitourinary:  Negative for dysuria, flank pain, frequency, hematuria, urgency and vaginal discharge.  Musculoskeletal:  Negative for arthralgias, back pain, myalgias and neck pain.  Skin:  Negative for rash.  Neurological:  Negative for dizziness, weakness and headaches.  Hematological:  Negative for adenopathy. Does not bruise/bleed easily.  Psychiatric/Behavioral:  Positive for depression. Negative for decreased concentration, dysphoric mood and suicidal ideas. The patient is not nervous/anxious and does not have insomnia.     Patient Active Problem List   Diagnosis Date Noted  Uterine leiomyoma 12/27/2022   Left breast abscess    Encounter for screening colonoscopy    Obesity 07/26/2017   Status post arthroscopy of left knee 07/26/2017   Primary osteoarthritis of left knee 03/29/2017    No Known Allergies  Past Surgical History:  Procedure Laterality Date    BREAST BIOPSY Left 04/22/2018   US Aspiration, US biopsy and Lymph node biopsy   BREAST CYST EXCISION Left    neg   BREAST CYST EXCISION Left 02/28/2022   Procedure: ABSCESS EXCISION BREAST, recurrent abscess;  Surgeon: Henrene Dodge, MD;  Location: [facility];  Service: General;  Laterality: Left;   COLONOSCOPY WITH PROPOFOL N/A 01/26/2022   Procedure: COLONOSCOPY WITH PROPOFOL;  Surgeon: Midge Minium, MD;  Location: [facility];  Service: Endoscopy;  Laterality: N/A;   FOOT SURGERY Bilateral 2010   bunions;hammertoe   KNEE ARTHROSCOPY Left 04/12/2017   Procedure: ARTHROSCOPY KNEE with chonadroplasty with partial menisectomy;  Surgeon: Erin Sons, MD;  Location: [facility];  Service: Orthopedics;  Laterality: Left;    Social History   Tobacco Use   Smoking status: Former    Current packs/day: 0.00    Types: Cigarettes    Quit date: 01/31/2010    Years since quitting: 13.8   Smokeless tobacco: Never  Vaping Use   Vaping status: Never Used  Substance Use Topics   Alcohol use: Not Currently    Comment: occasssional   Drug use: No     Medication list has been reviewed and updated.  Current Meds  Medication Sig   acetaminophen (TYLENOL) 500 MG tablet Take 1,000 mg by mouth daily as needed for moderate pain.   hydrochlorothiazide (HYDRODIURIL) 12.5 MG tablet Take 1 tablet (12.5 mg total) by mouth daily.   ibuprofen (ADVIL) 800 MG tablet Take 1 tablet (800 mg total) by mouth every 8 (eight) hours as needed.   sertraline (ZOLOFT) 50 MG tablet Take one and a half tablet a day.( Take 1 tablet at night and one half tablet in morning)       12/13/2023    9:09 AM 11/01/2023    3:26 PM 09/19/2023    3:26 PM 06/25/2023    2:37 PM  GAD 7 : Generalized Anxiety Score  Nervous, Anxious, on Edge 1 1 1 1   Control/stop worrying 3 2 3  0  Worry too much - different things 3 2 2  0  Trouble relaxing 3 2 3 1   Restless 3 2 2  0  Easily annoyed or irritable 3 2 2  0  Afraid -  awful might happen 3 3 2  0  Total GAD 7 Score 19 14 15 2   Anxiety Difficulty Somewhat difficult Somewhat difficult Somewhat difficult Not difficult at all       12/13/2023    9:08 AM 11/01/2023    3:25 PM 09/19/2023    3:25 PM  Depression screen PHQ 2/9  Decreased Interest 2 0 3  Down, Depressed, Hopeless 3 3 3   PHQ - 2 Score 5 3 6   Altered sleeping 2 2 3   Tired, decreased energy 2 2 2   Change in appetite 2 2 3   Feeling bad or failure about yourself  1 1 0  Trouble concentrating 2 2 3   Moving slowly or fidgety/restless 0 2 0  Suicidal thoughts 0 0 0  PHQ-9 Score 14 14 17   Difficult doing work/chores Not difficult at all Somewhat difficult Somewhat difficult    BP Readings from Last 3 Encounters:  12/13/23 118/82  11/01/23  110/86  09/19/23 122/70    Physical Exam Vitals and nursing note reviewed.  Constitutional:      General: She is not irritable.She is not in acute distress.    Appearance: She is not diaphoretic.  HENT:     Head: Normocephalic and atraumatic.     Right Ear: External ear normal.     Left Ear: External ear normal.     Nose: Nose normal.     Mouth/Throat:     Mouth: Mucous membranes are moist.     Pharynx: No oropharyngeal exudate.  Eyes:     General:        Right eye: No discharge.        Left eye: No discharge.     Conjunctiva/sclera: Conjunctivae normal.     Pupils: Pupils are equal, round, and reactive to light.  Neck:     Thyroid: No thyromegaly.     Vascular: No JVD.  Cardiovascular:     Rate and Rhythm: Normal rate and regular rhythm.     Heart sounds: Normal heart sounds. No murmur heard.    No friction rub. No gallop.  Pulmonary:     Effort: Pulmonary effort is normal.     Breath sounds: Normal breath sounds. No wheezing, rhonchi or rales.  Abdominal:     General: Bowel sounds are normal.     Palpations: Abdomen is soft. There is no mass.     Tenderness: There is no abdominal tenderness. There is no guarding.  Musculoskeletal:         General: Normal range of motion.     Cervical back: Normal range of motion and neck supple.  Lymphadenopathy:     Cervical: No cervical adenopathy.  Skin:    General: Skin is warm and dry.  Neurological:     Mental Status: She is alert.     Motor: No weakness.     Deep Tendon Reflexes: Reflexes are normal and symmetric.     Wt Readings from Last 3 Encounters:  12/13/23 172 lb (78 kg)  11/01/23 179 lb (81.2 kg)  09/19/23 177 lb 6.4 oz (80.5 kg)    BP 118/82 (BP Location: Left Arm, Patient Position: Sitting, Cuff Size: Large)   Resp 16   Ht 5\' 5"  (1.651 m)   Wt 172 lb (78 kg)   LMP 11/29/2023 (Exact Date)   SpO2 100%   BMI 28.62 kg/m   Assessment and Plan:  1. Primary osteoarthritis of right knee Chronic.  Episodic.  Uncontrolled stable.  Patient takes only as needed 800 mg ibuprofen in the morning once a day I discussed with her that long-term use of this class of medications may adversely affect her kidneys and that we need to keep a watch on this we will check lab work on her including electrolytes and GFR for current level of control. - ibuprofen (ADVIL) 800 MG tablet; Take 1 tablet (800 mg total) by mouth daily as needed.  Dispense: 30 tablet; Refill: 5  2. Reactive depression Chronic.  Uncontrolled.  Stable.  Nonsuicidal.  PHQ is 14.  Patient is having reactive depression due to the sudden loss of her daughter anniversary date will be in April of this year.  Patient is currently on 75 mg of sertraline and would after discussion patient agrees to increase to 100 mg sertraline.  We will recheck patient in 6 weeks.  - sertraline (ZOLOFT) 100 MG tablet; Take 1 tablet (100 mg total) by mouth daily.  Dispense: 30 tablet;  Refill: 3  3. Anxiety Chronic.  Episodic.  Stable.  Uncontrolled.  Patient has anxiety which was triggered by the death of her daughter.  Currently is taking 75 mg of sertraline and we will increase to 100 mg and will recheck patient in 6 weeks. - sertraline  (ZOLOFT) 100 MG tablet; Take 1 tablet (100 mg total) by mouth daily.  Dispense: 30 tablet; Refill: 3  4. Primary hypertension Chronic.  Controlled.  Stable.  Blood pressure 118/82.  Tolerating medication well.  Asymptomatic.  Continue hydrochlorothiazide 12.5 mg once a day.  Will check CMP for electrolytes and GFR.  Will recheck blood pressure in 6 months. - hydrochlorothiazide (HYDRODIURIL) 12.5 MG tablet; Take 1 tablet (12.5 mg total) by mouth daily.  Dispense: 90 tablet; Refill: 1 - Comprehensive metabolic panel with GFR    Elizabeth Sauer, MD

## 2023-12-13 NOTE — Patient Instructions (Signed)

## 2023-12-20 DIAGNOSIS — I10 Essential (primary) hypertension: Secondary | ICD-10-CM | POA: Diagnosis not present

## 2023-12-21 ENCOUNTER — Other Ambulatory Visit: Payer: Self-pay | Admitting: Family Medicine

## 2023-12-21 ENCOUNTER — Encounter: Payer: Self-pay | Admitting: Family Medicine

## 2023-12-21 LAB — COMPREHENSIVE METABOLIC PANEL WITH GFR
ALT: 14 IU/L (ref 0–32)
AST: 24 IU/L (ref 0–40)
Albumin: 4.4 g/dL (ref 3.9–4.9)
Alkaline Phosphatase: 88 IU/L (ref 44–121)
BUN/Creatinine Ratio: 14 (ref 9–23)
BUN: 13 mg/dL (ref 6–24)
Bilirubin Total: 0.5 mg/dL (ref 0.0–1.2)
CO2: 27 mmol/L (ref 20–29)
Calcium: 9.7 mg/dL (ref 8.7–10.2)
Chloride: 96 mmol/L (ref 96–106)
Creatinine, Ser: 0.94 mg/dL (ref 0.57–1.00)
Globulin, Total: 3.3 g/dL (ref 1.5–4.5)
Glucose: 101 mg/dL — ABNORMAL HIGH (ref 70–99)
Potassium: 3.4 mmol/L — ABNORMAL LOW (ref 3.5–5.2)
Sodium: 140 mmol/L (ref 134–144)
Total Protein: 7.7 g/dL (ref 6.0–8.5)
eGFR: 74 mL/min/{1.73_m2} (ref >59)

## 2023-12-21 LAB — LIPID PANEL WITH LDL/HDL RATIO
Cholesterol, Total: 125 mg/dL (ref 100–199)
HDL: 55 mg/dL (ref >39)
LDL Chol Calc (NIH): 39 mg/dL (ref 0–99)
LDL/HDL Ratio: 0.7 ratio (ref 0.0–3.2)
Triglycerides: 198 mg/dL — ABNORMAL HIGH (ref 0–149)
VLDL Cholesterol Cal: 31 mg/dL (ref 5–40)

## 2023-12-21 MED ORDER — POTASSIUM CHLORIDE CRYS ER 10 MEQ PO TBCR: 10.0000 meq | EXTENDED_RELEASE_TABLET | Freq: Every day | ORAL | 5 refills | Status: DC

## 2024-01-06 ENCOUNTER — Ambulatory Visit (INDEPENDENT_AMBULATORY_CARE_PROVIDER_SITE_OTHER): Admitting: Surgery

## 2024-01-06 ENCOUNTER — Encounter: Payer: Self-pay | Admitting: Surgery

## 2024-01-06 VITALS — BP 122/81 | HR 86 | Temp 98.6°F | Ht 65.0 in | Wt 175.4 lb

## 2024-01-06 DIAGNOSIS — N611 Abscess of the breast and nipple: Secondary | ICD-10-CM

## 2024-01-06 MED ORDER — AMOXICILLIN-POT CLAVULANATE 875-125 MG PO TABS: 1.0000 | ORAL_TABLET | Freq: Two times a day (BID) | ORAL | 0 refills | Status: AC

## 2024-01-06 NOTE — Progress Notes (Signed)
 01/06/2024  History of Present Illness: Angela French is a 53 y.o. female status post excision of a left retroareolar abscess cavity on 02/28/2022 for a recurrent left retroareolar abscess.  The patient's wound was left to close under secondary intention at her last appointment was on 04/24/2022.  The patient reports that she has been doing very well until about a week ago that she started having again swelling and tenderness in the left breast at the same exact location as her prior surgery.  Then 2 days ago the area started to have spontaneous drainage.  She reports that this had a foul odor.  Denies any fevers or chills.  She has been placing a dry gauze dressing over the wound.  Past Medical History: Past Medical History:  Diagnosis Date   Anemia    Arthritis    knee   Hammertoe of right foot 02/2022   Hypertension    Left breast abscess 02/2022   Osteoarthritis of left knee 2018   Wears contact lenses      Past Surgical History: Past Surgical History:  Procedure Laterality Date   BREAST BIOPSY Left 04/22/2018   US  Aspiration, US  biopsy and Lymph node biopsy   BREAST CYST EXCISION Left    neg   BREAST CYST EXCISION Left 02/28/2022   Procedure: ABSCESS EXCISION BREAST, recurrent abscess;  Surgeon: Emmalene Hare, MD;  Location: [facility];  Service: General;  Laterality: Left;   COLONOSCOPY WITH PROPOFOL  N/A 01/26/2022   Procedure: COLONOSCOPY WITH PROPOFOL ;  Surgeon: Marnee Sink, MD;  Location: [facility];  Service: Endoscopy;  Laterality: N/A;   FOOT SURGERY Bilateral 2010   bunions;hammertoe   KNEE ARTHROSCOPY Left 04/12/2017   Procedure: ARTHROSCOPY KNEE with chonadroplasty with partial menisectomy;  Surgeon: Josephus Nida, MD;  Location: [facility];  Service: Orthopedics;  Laterality: Left;    Home Medications: Prior to Admission medications   Medication Sig Start Date End Date Taking? Authorizing Provider  amoxicillin -clavulanate (AUGMENTIN ) 875-125 MG  tablet Take 1 tablet by mouth 2 (two) times daily for 7 days. 01/06/24 01/13/24 Yes Tama Grosz, Volanda Gruber, MD  acetaminophen  (TYLENOL ) 500 MG tablet Take 1,000 mg by mouth daily as needed for moderate pain.    [provider]  hydrochlorothiazide  (HYDRODIURIL ) 12.5 MG tablet Take 1 tablet (12.5 mg total) by mouth daily. 12/13/23   Clarise Crooks, MD  ibuprofen  (ADVIL ) 800 MG tablet Take 1 tablet (800 mg total) by mouth daily as needed. 12/13/23   Clarise Crooks, MD  potassium chloride  (KLOR-CON  M) 10 MEQ tablet Take 1 tablet (10 mEq total) by mouth daily. 12/21/23   Clarise Crooks, MD  sertraline  (ZOLOFT ) 100 MG tablet Take 1 tablet (100 mg total) by mouth daily. 12/13/23   Clarise Crooks, MD    Allergies: No Known Allergies  Review of Systems: Review of Systems  Constitutional:  Negative for chills and fever.  Respiratory:  Negative for shortness of breath.   Cardiovascular:  Negative for chest pain.  Gastrointestinal:  Negative for nausea and vomiting.  Skin:        Left breast swelling and tenderness.    Physical Exam BP 122/81   Pulse 86   Temp 98.6 F (37 C) (Oral)   Ht 5\' 5"  (1.651 m)   Wt 175 lb 6.4 oz (79.6 kg)   LMP 11/29/2023 (Exact Date)   SpO2 99%   BMI 29.19 kg/m  CONSTITUTIONAL: No acute distress, well-nourished HEENT:  Normocephalic, atraumatic, extraocular motion intact. RESPIRATORY:  Normal respiratory effort without pathologic use of accessory muscles. CARDIOVASCULAR: Regular rhythm and rate. BREAST: Left breast has induration changes in the superior half of the areola and just superior to it.  There is the prior incision that has been done for her excision.  In the center of it, there is a wound that is about 6 mm in size with beefy red granulation tissue at the edges.  There is very minimal purulence when I was doing deep palpation of the area.  The wound was probed with a Q-tip which leads to a cavity that is about 1.5 cm in size.  Q-tip was used to scrape the  walls of this cavity with some slough/debris able to be pulled out.  Otherwise the wound appeared to be clean.  The wound was packed with a 2 x 2 gauze piece and then dressed with 4 x 4 gauze and tape. NEUROLOGIC:  Motor and sensation is grossly normal.  Cranial nerves are grossly intact. PSYCH:  Alert and oriented to person, place and time. Affect is normal.  Assessment and Plan: This is a 53 y.o. female with a recurrent left breast retroareolar abscess.  - Discussed with the patient that this recurrence could be as a result of a small pocket that is left behind as the wound was healing from her surgery almost 2 years ago.  However based on the debris that was pulled out, this almost looks like a sebaceous cyst as the debris appeared to be thin capsule.  Potentially she may have developed a sebaceous cyst in this region although I think a recurrence of an abscess is more likely.  Currently the wound does not need any debridement and no procedures are needed at this point.  However given the recent infection, I will prescribe her a course of Augmentin  for 7 days.  Discussed with her that tomorrow she can pull the packing gauze and then start applying just dry gauze dressing on top to change once daily. - She will follow-up with me in 2 weeks to reassess her wound.  Discussed with her briefly that we could potentially do watchful waiting once this heals versus going back to the operating room to reexcised this cavity and.  Patient will keep thinking about it we will revisit this again in 2 weeks when we see her.  I spent 20 minutes dedicated to the care of this patient on the date of this encounter to include pre-visit review of records, face-to-face time with the patient discussing diagnosis and management, and any post-visit coordination of care.   Marene Shape, MD [facility]

## 2024-01-06 NOTE — Patient Instructions (Addendum)
 Before you shower tomorrow, you will need to pull the gauze out. You may do this by wetting the area to make it easier to remove.   Abscess  A skin abscess is an infected spot of skin. It can have pus in it. An abscess can happen in any part of your body. Some abscesses break open (rupture) on their own. Most keep getting worse unless they are treated. If your abscess is not treated, the infection can spread deeper into your body and blood. This can make you feel sick. What are the causes? Germs that enter your skin. This may happen if you have: A cut or scrape. A wound from a needle or an insect bite. Blocked oil or sweat glands. A problem with the spot where your hair goes into your skin. A fluid-filled sac called a cyst under your skin. What increases the risk? Having problems with how your blood moves through your body. Having a weak body defense system (immune system). Having diabetes. Having dry and irritated skin. Needing to get shots often. Putting drugs into your body with a needle. Having a splinter or something else in your skin. Smoking. What are the signs or symptoms? A firm bump under your skin that hurts. A bump with pus at the top. Redness and swelling. Warm or tender spots. A sore on the skin. How is this treated? You may need to: Put a heat pack or a warm, wet washcloth on the spot. Have the pus drained. Take antibiotics. Follow these instructions at home: Medicines Take over-the-counter and prescription medicines only as told by your doctor. If you were prescribed antibiotics, take them as told by your doctor. Do not stop taking them even if you start to feel better. Abscess care  If you have an abscess that has not drained, put heat on it. Use the heat source that your doctor recommends, such as a moist heat pack or a heating pad. Place a towel between your skin and the heat source. Leave the heat on for 20-30 minutes. If your skin turns bright red, take  off the heat right away to prevent burns. The risk of burns is higher if you cannot feel pain, heat, or cold. Follow instructions from your doctor about how to take care of your abscess. Make sure you: Cover the abscess with a bandage. Wash your hands with soap and water  for at least 20 seconds before and after you change your bandage. If you cannot use soap and water , use hand sanitizer. Change your bandage as told by your doctor. Check your abscess every day for signs that the infection is getting worse. Check for: More redness, swelling, or pain. More fluid or blood. Warmth. More pus or a worse smell. General instructions To keep the infection from spreading: Do not share personal items or towels. Do not go in a hot tub with others. Avoid making skin contact with others. Be careful when you get rid of used bandages or any pus from the abscess. Do not smoke or use any products that contain nicotine or tobacco. If you need help quitting, ask your doctor. Contact a doctor if: You see red streaks on your skin near the abscess. You have any signs of worse infection. You vomit every time you eat or drink. You have a fever, chills, or muscle aches. The cyst or abscess comes back. Get help right away if: You have very bad pain. You make less pee (urine) than normal. This information is not intended  to replace advice given to you by your health care provider. Make sure you discuss any questions you have with your health care provider. Document Revised: 04/18/2022 Document Reviewed: 04/18/2022 Elsevier Patient Education  2024 [employer].

## 2024-01-07 ENCOUNTER — Other Ambulatory Visit: Payer: Self-pay | Admitting: Family Medicine

## 2024-01-07 DIAGNOSIS — F329 Major depressive disorder, single episode, unspecified: Secondary | ICD-10-CM

## 2024-01-07 DIAGNOSIS — F419 Anxiety disorder, unspecified: Secondary | ICD-10-CM

## 2024-01-20 ENCOUNTER — Ambulatory Visit (INDEPENDENT_AMBULATORY_CARE_PROVIDER_SITE_OTHER): Admitting: Surgery

## 2024-01-20 VITALS — BP 131/85 | HR 83 | Ht 65.0 in | Wt 175.0 lb

## 2024-01-20 DIAGNOSIS — N611 Abscess of the breast and nipple: Secondary | ICD-10-CM

## 2024-01-20 NOTE — Progress Notes (Signed)
 01/20/2024  History of Present Illness: Angela French is a 53 y.o. female presenting for follow up of a recurrent left breast abscess.  She was last seen on 01/06/24 at which time she had 1 week of swelling and tenderness in the left breast at the site of prior abscess excision in 2023.  There area had spontaneously started draining 2 days prior to her visit.  Started her on Augmentin  and packed the wound with 2x2 gauze.  She reports that the pain and swelling are fully resolved now.  She reports some drainage still, but the wound is getting smaller.  Past Medical History: Past Medical History:  Diagnosis Date   Anemia    Arthritis    knee   Hammertoe of right foot 02/2022   Hypertension    Left breast abscess 02/2022   Osteoarthritis of left knee 2018   Wears contact lenses      Past Surgical History: Past Surgical History:  Procedure Laterality Date   BREAST BIOPSY Left 04/22/2018   US  Aspiration, US  biopsy and Lymph node biopsy   BREAST CYST EXCISION Left    neg   BREAST CYST EXCISION Left 02/28/2022   Procedure: ABSCESS EXCISION BREAST, recurrent abscess;  Surgeon: Emmalene Hare, MD;  Location: [facility];  Service: General;  Laterality: Left;   COLONOSCOPY WITH PROPOFOL  N/A 01/26/2022   Procedure: COLONOSCOPY WITH PROPOFOL ;  Surgeon: Marnee Sink, MD;  Location: [facility];  Service: Endoscopy;  Laterality: N/A;   FOOT SURGERY Bilateral 2010   bunions;hammertoe   KNEE ARTHROSCOPY Left 04/12/2017   Procedure: ARTHROSCOPY KNEE with chonadroplasty with partial menisectomy;  Surgeon: Josephus Nida, MD;  Location: [facility];  Service: Orthopedics;  Laterality: Left;    Home Medications: Prior to Admission medications   Medication Sig Start Date End Date Taking? Authorizing Provider  acetaminophen  (TYLENOL ) 500 MG tablet Take 1,000 mg by mouth daily as needed for moderate pain.   Yes [provider]  hydrochlorothiazide  (HYDRODIURIL ) 12.5 MG tablet  Take 1 tablet (12.5 mg total) by mouth daily. 12/13/23  Yes Clarise Crooks, MD  ibuprofen  (ADVIL ) 800 MG tablet Take 1 tablet (800 mg total) by mouth daily as needed. 12/13/23  Yes Clarise Crooks, MD  potassium chloride  (KLOR-CON  M) 10 MEQ tablet Take 1 tablet (10 mEq total) by mouth daily. 12/21/23  Yes Clarise Crooks, MD  sertraline  (ZOLOFT ) 100 MG tablet TAKE 1 TABLET BY MOUTH EVERY DAY 01/08/24  Yes Clarise Crooks, MD    Allergies: No Known Allergies  Review of Systems: Review of Systems  Constitutional:  Negative for chills and fever.  Respiratory:  Negative for shortness of breath.   Cardiovascular:  Negative for chest pain.  Skin:        Left breast swelling/firmness resolved    Physical Exam BP 131/85   Pulse 83   Ht 5\' 5"  (1.651 m)   Wt 175 lb (79.4 kg)   LMP 01/07/2024 (Exact Date)   SpO2 100%   BMI 29.12 kg/m  CONSTITUTIONAL: No acute distress HEENT:  Normocephalic, atraumatic, extraocular motion intact. RESPIRATORY:  Normal respiratory effort without pathologic use of accessory muscles. CARDIOVASCULAR: Regular rhythm and rate. BREAST:  Left breast wound at the superior border of the areola is healing well, much smaller now, unable to probe with qtip head and had to use the back of qtip to evaluate the wound.  The wound tracks directly posterior to the nipple about 1 cm.  Some mild purulent  drainage expressed.  No further erythema or induration.  No pain when probing wound. NEUROLOGIC:  Motor and sensation is grossly normal.  Cranial nerves are grossly intact. PSYCH:  Alert and oriented to person, place and time. Affect is normal.   Assessment and Plan: This is a 53 y.o. female with recurrent left breast retroareolar abscess  --Discussed with the patient that the wound is healing, appears less deep and smaller overall.  Tracks directly posterior to the nipple.  Inflammatory changes are now resolved after antibiotic course. --Discussed with the patient the options for  continued monitoring of the area vs proceeding to surgery for repeat I&D of the abscess cavity.  For now, she would like to wait and monitor this area.  If the wound heals and everything resolves, she would like to avoid surgery again.  However, she understands that if the wound continues to drain or does not heal, may need I&D again. --Recommended that she try warm compresses to help promote more fluid coming out to heal the wound better.  No antibiotics needed at this point. --Follow up as needed.  I spent 10 minutes dedicated to the care of this patient on the date of this encounter to include pre-visit review of records, face-to-face time with the patient discussing diagnosis and management, and any post-visit coordination of care.   Marene Shape, MD [facility]

## 2024-01-20 NOTE — Patient Instructions (Addendum)
 Continue to change your dressing daily. You may also place a warm compress over the breast to encourage any drainage that may need to come out.  Keep a dressing on this area until it stops draining.    Follow-up with our office as needed.  Please call and ask to speak with a nurse if you develop questions or concerns.

## 2024-01-24 ENCOUNTER — Ambulatory Visit (INDEPENDENT_AMBULATORY_CARE_PROVIDER_SITE_OTHER): Admitting: Family Medicine

## 2024-01-24 ENCOUNTER — Encounter: Payer: Self-pay | Admitting: Family Medicine

## 2024-01-24 VITALS — BP 124/82 | HR 69 | Ht 65.0 in | Wt 177.0 lb

## 2024-01-24 DIAGNOSIS — I10 Essential (primary) hypertension: Secondary | ICD-10-CM | POA: Diagnosis not present

## 2024-01-24 MED ORDER — HYDROCHLOROTHIAZIDE 12.5 MG PO TABS: 12.5000 mg | ORAL_TABLET | Freq: Every day | ORAL | 1 refills | Status: DC

## 2024-01-24 NOTE — Progress Notes (Signed)
 Date:  01/24/2024   Name:  Angela French   DOB:  Oct 01, 1970   MRN:  [id number]   Chief Complaint: Follow-up  Hypertension This is a chronic problem. The current episode started more than 1 year ago. The problem has been gradually improving since onset. The problem is controlled. Pertinent negatives include no anxiety, blurred vision, chest pain, headaches, malaise/fatigue, neck pain, orthopnea, palpitations, peripheral edema, PND, shortness of breath or sweats. There are no associated agents to hypertension. There are no known risk factors for coronary artery disease. Past treatments include diuretics. The current treatment provides moderate improvement. There are no compliance problems.  There is no history of chronic renal disease, a hypertension causing med or renovascular disease.    Lab Results  Component Value Date   NA 140 12/20/2023   K 3.4 (L) 12/20/2023   CO2 27 12/20/2023   GLUCOSE 101 (H) 12/20/2023   BUN 13 12/20/2023   CREATININE 0.94 12/20/2023   CALCIUM 9.7 12/20/2023   EGFR 74 12/20/2023   GFRNONAA 87 05/12/2020   Lab Results  Component Value Date   CHOL 125 12/20/2023   HDL 55 12/20/2023   LDLCALC 39 12/20/2023   TRIG 198 (H) 12/20/2023   CHOLHDL 1.9 06/24/2019   No results found for: "TSH" No results found for: "HGBA1C" Lab Results  Component Value Date   WBC 6.1 02/28/2022   HGB 11.2 (L) 02/28/2022   HCT 36.3 02/28/2022   MCV 80.0 02/28/2022   PLT 581 (H) 02/28/2022   Lab Results  Component Value Date   ALT 14 12/20/2023   AST 24 12/20/2023   ALKPHOS 88 12/20/2023   BILITOT 0.5 12/20/2023   No results found for: "25OHVITD2", "25OHVITD3", "VD25OH"   Review of Systems  Constitutional:  Negative for malaise/fatigue.  Eyes:  Negative for blurred vision.  Respiratory:  Negative for choking, chest tightness, shortness of breath and wheezing.   Cardiovascular:  Negative for chest pain, palpitations, orthopnea, leg swelling and PND.   Musculoskeletal:  Negative for neck pain.  Neurological:  Negative for headaches.    Patient Active Problem List   Diagnosis Date Noted   Uterine leiomyoma 12/27/2022   Left breast abscess    Encounter for screening colonoscopy    Obesity 07/26/2017   Status post arthroscopy of left knee 07/26/2017   Primary osteoarthritis of left knee 03/29/2017    No Known Allergies  Past Surgical History:  Procedure Laterality Date   BREAST BIOPSY Left 04/22/2018   US  Aspiration, US  biopsy and Lymph node biopsy   BREAST CYST EXCISION Left    neg   BREAST CYST EXCISION Left 02/28/2022   Procedure: ABSCESS EXCISION BREAST, recurrent abscess;  Surgeon: Emmalene Hare, MD;  Location: [facility];  Service: General;  Laterality: Left;   COLONOSCOPY WITH PROPOFOL  N/A 01/26/2022   Procedure: COLONOSCOPY WITH PROPOFOL ;  Surgeon: Marnee Sink, MD;  Location: [facility];  Service: Endoscopy;  Laterality: N/A;   FOOT SURGERY Bilateral 2010   bunions;hammertoe   KNEE ARTHROSCOPY Left 04/12/2017   Procedure: ARTHROSCOPY KNEE with chonadroplasty with partial menisectomy;  Surgeon: Josephus Nida, MD;  Location: [facility];  Service: Orthopedics;  Laterality: Left;    Social History   Tobacco Use   Smoking status: Former    Current packs/day: 0.00    Types: Cigarettes    Quit date: 01/31/2010    Years since quitting: 13.9    Passive exposure: Past   Smokeless tobacco: Never  Vaping Use  Vaping status: Never Used  Substance Use Topics   Alcohol use: Not Currently    Comment: occasssional   Drug use: No     Medication list has been reviewed and updated.  Current Meds  Medication Sig   acetaminophen  (TYLENOL ) 500 MG tablet Take 1,000 mg by mouth daily as needed for moderate pain.   hydrochlorothiazide  (HYDRODIURIL ) 12.5 MG tablet Take 1 tablet (12.5 mg total) by mouth daily.   potassium chloride  (KLOR-CON  M) 10 MEQ tablet Take 1 tablet (10 mEq total) by mouth daily.    sertraline  (ZOLOFT ) 100 MG tablet TAKE 1 TABLET BY MOUTH EVERY DAY       01/24/2024   11:44 AM 12/13/2023    9:09 AM 11/01/2023    3:26 PM 09/19/2023    3:26 PM  GAD 7 : Generalized Anxiety Score  Nervous, Anxious, on Edge 1 1 1 1   Control/stop worrying 3 3 2 3   Worry too much - different things 2 3 2 2   Trouble relaxing 3 3 2 3   Restless 3 3 2 2   Easily annoyed or irritable 3 3 2 2   Afraid - awful might happen 2 3 3 2   Total GAD 7 Score 17 19 14 15   Anxiety Difficulty Extremely difficult Somewhat difficult Somewhat difficult Somewhat difficult       01/24/2024   11:44 AM 12/13/2023    9:08 AM 11/01/2023    3:25 PM  Depression screen PHQ 2/9  Decreased Interest 3 2 0  Down, Depressed, Hopeless 2 3 3   PHQ - 2 Score 5 5 3   Altered sleeping 2 2 2   Tired, decreased energy 2 2 2   Change in appetite 2 2 2   Feeling bad or failure about yourself  3 1 1   Trouble concentrating 3 2 2   Moving slowly or fidgety/restless 3 0 2  Suicidal thoughts 0 0 0  PHQ-9 Score 20 14 14   Difficult doing work/chores Somewhat difficult Not difficult at all Somewhat difficult    BP Readings from Last 3 Encounters:  01/24/24 124/82  01/20/24 131/85  01/06/24 122/81    Physical Exam Vitals reviewed.  Constitutional:      General: She is not in acute distress.    Appearance: She is not diaphoretic.  HENT:     Head: Normocephalic and atraumatic.     Right Ear: External ear normal. There is impacted cerumen.     Left Ear: External ear normal. There is impacted cerumen.     Nose: Nose normal. No congestion or rhinorrhea.     Mouth/Throat:     Mouth: Mucous membranes are moist.  Eyes:     General:        Right eye: No discharge.        Left eye: No discharge.     Conjunctiva/sclera: Conjunctivae normal.     Pupils: Pupils are equal, round, and reactive to light.  Neck:     Thyroid: No thyromegaly.     Vascular: No JVD.  Cardiovascular:     Rate and Rhythm: Normal rate and regular rhythm.      Heart sounds: Normal heart sounds. No murmur heard.    No friction rub. No gallop.  Pulmonary:     Effort: Pulmonary effort is normal.     Breath sounds: Normal breath sounds. No wheezing, rhonchi or rales.  Abdominal:     General: Bowel sounds are normal.     Palpations: Abdomen is soft. There is no mass.  Tenderness: There is no abdominal tenderness. There is no guarding.  Musculoskeletal:        General: Normal range of motion.     Cervical back: Normal range of motion and neck supple.  Lymphadenopathy:     Cervical: No cervical adenopathy.  Skin:    General: Skin is warm and dry.  Neurological:     Mental Status: She is alert.     Deep Tendon Reflexes: Reflexes are normal and symmetric.     Wt Readings from Last 3 Encounters:  01/24/24 177 lb (80.3 kg)  01/20/24 175 lb (79.4 kg)  01/06/24 175 lb 6.4 oz (79.6 kg)    BP 124/82   Pulse 69   Ht 5\' 5"  (1.651 m)   Wt 177 lb (80.3 kg)   LMP 01/07/2024 (Exact Date)   SpO2 99%   BMI 29.45 kg/m   Assessment and Plan:  1. Primary hypertension (Primary) New onset.  Controlled.  Stable.  Patient was started on hydrochlorothiazide  12.5 mg once a day following blood pressures that have been mildly elevated in the recent past.  Patient also continues to monitor her sodium intake.  Patient returns for blood pressure check and it is excellent at 124/82.  We will continue on current dosing and will recheck patient in 6 months. - hydrochlorothiazide  (HYDRODIURIL ) 12.5 MG tablet; Take 1 tablet (12.5 mg total) by mouth daily.  Dispense: 90 tablet; Refill: 1 - Renal Function Panel    Alayne Allis, MD

## 2024-02-03 DIAGNOSIS — I10 Essential (primary) hypertension: Secondary | ICD-10-CM | POA: Diagnosis not present

## 2024-02-04 LAB — RENAL FUNCTION PANEL
Albumin: 4.4 g/dL (ref 3.9–4.9)
BUN/Creatinine Ratio: 13 (ref 9–23)
BUN: 10 mg/dL (ref 6–24)
CO2: 24 mmol/L (ref 20–29)
Calcium: 9.6 mg/dL (ref 8.7–10.2)
Chloride: 99 mmol/L (ref 96–106)
Creatinine, Ser: 0.8 mg/dL (ref 0.57–1.00)
Glucose: 83 mg/dL (ref 70–99)
Phosphorus: 3.8 mg/dL (ref 3.0–4.3)
Potassium: 4.1 mmol/L (ref 3.5–5.2)
Sodium: 140 mmol/L (ref 134–144)
eGFR: 90 mL/min/{1.73_m2} (ref >59)

## 2024-02-05 ENCOUNTER — Ambulatory Visit: Payer: Self-pay | Admitting: Family Medicine

## 2024-03-26 DIAGNOSIS — M170 Bilateral primary osteoarthritis of knee: Secondary | ICD-10-CM | POA: Diagnosis not present

## 2024-04-30 ENCOUNTER — Encounter: Payer: Self-pay | Admitting: Family Medicine

## 2024-04-30 ENCOUNTER — Encounter: Payer: BC Managed Care – PPO | Admitting: Student

## 2024-08-26 ENCOUNTER — Ambulatory Visit: Admitting: Family Medicine

## 2024-08-26 ENCOUNTER — Encounter: Payer: Self-pay | Admitting: Family Medicine

## 2024-08-26 VITALS — BP 116/78 | HR 90 | Ht 65.0 in | Wt 184.0 lb

## 2024-08-26 DIAGNOSIS — Z803 Family history of malignant neoplasm of breast: Secondary | ICD-10-CM | POA: Diagnosis not present

## 2024-08-26 DIAGNOSIS — N951 Menopausal and female climacteric states: Secondary | ICD-10-CM | POA: Diagnosis not present

## 2024-08-26 DIAGNOSIS — D259 Leiomyoma of uterus, unspecified: Secondary | ICD-10-CM

## 2024-08-26 DIAGNOSIS — Z23 Encounter for immunization: Secondary | ICD-10-CM | POA: Diagnosis not present

## 2024-08-26 DIAGNOSIS — I10 Essential (primary) hypertension: Secondary | ICD-10-CM

## 2024-08-26 DIAGNOSIS — Z131 Encounter for screening for diabetes mellitus: Secondary | ICD-10-CM

## 2024-08-26 DIAGNOSIS — Z79899 Other long term (current) drug therapy: Secondary | ICD-10-CM | POA: Diagnosis not present

## 2024-08-26 DIAGNOSIS — Z1231 Encounter for screening mammogram for malignant neoplasm of breast: Secondary | ICD-10-CM | POA: Diagnosis not present

## 2024-08-26 DIAGNOSIS — Z1159 Encounter for screening for other viral diseases: Secondary | ICD-10-CM

## 2024-08-26 MED ORDER — POTASSIUM CHLORIDE CRYS ER 10 MEQ PO TBCR: 10.0000 meq | EXTENDED_RELEASE_TABLET | Freq: Every day | ORAL | 5 refills | Status: AC

## 2024-08-26 MED ORDER — HYDROCHLOROTHIAZIDE 12.5 MG PO TABS: 12.5000 mg | ORAL_TABLET | Freq: Every day | ORAL | 1 refills | Status: AC

## 2024-08-26 NOTE — Progress Notes (Signed)
 Established Patient Office Visit  Patient ID: Angela French, female    DOB: Feb 16, 1971  Age: 53 y.o. MRN: [id number] PCP: Angela Gover K, MD  Chief Complaint  Patient presents with   Establish Care    Patient presents today to establish care. She would like to discuss possible pre menopause today.      Subjective:     HPI  Discussed the use of AI scribe software for clinical note transcription with the patient, who gave verbal consent to proceed.  History of Present Illness Angela French is a 53 year old female who presents with concerns about menopausal symptoms and irregular menstruation.  She has not had her period since August and wonders if she is premenopausal. She experiences hot flashes and fatigue. She has been taking pregnancy tests, all of which have been negative.  She is currently on Zoloft  for depression, which she started after losing her daughter and father in the same year. The medication is working well, and she does not feel the need for a dosage increase. She is also on medication for blood pressure, which she takes regularly, although she does not check her blood pressure frequently.  She has experienced weight loss, previously weighing up to 215 pounds. She has a family history of breast cancer, with her grandmother and sister having had the disease. Her sister underwent surgery and reconstruction and is currently doing well.  She recalls having a uterine mass, described as a leiomyosarcoma or polyp, but is unsure of its current status as she has not had surgery for it. She had surgery on her left breast for a recurring cyst.  She works in set designer at [employer] and prefers morning appointments, particularly on Mondays or Thursdays.    Review of Systems  All other systems reviewed and are negative.     Objective:     BP 116/78   Pulse 90   Ht 5' 5 (1.651 m)   Wt 184 lb (83.5 kg)   SpO2 99%   BMI 30.62 kg/m  BP Readings from  Last 3 Encounters:  08/26/24 116/78  01/24/24 124/82  01/20/24 131/85      Physical Exam Vitals and nursing note reviewed.  Constitutional:      Appearance: Normal appearance.  HENT:     Head: Normocephalic.     Right Ear: External ear normal.     Left Ear: External ear normal.  Eyes:     Conjunctiva/sclera: Conjunctivae normal.  Cardiovascular:     Rate and Rhythm: Normal rate.  Pulmonary:     Effort: Pulmonary effort is normal. No respiratory distress.  Abdominal:     Palpations: Abdomen is soft.  Musculoskeletal:        General: Normal range of motion.  Skin:    General: Skin is warm.  Neurological:     Mental Status: She is alert and oriented to person, place, and time.  Psychiatric:        Mood and Affect: Mood normal.     Physical Exam     No results found for any visits on 08/26/24.     The ASCVD Risk score (Arnett DK, et al., 2019) failed to calculate for the following reasons:   The valid total cholesterol range is 130 to 320 mg/dL    Assessment & Plan:   Problem List Items Addressed This Visit       Genitourinary   Uterine leiomyoma   Relevant Orders   Ambulatory  referral to Obstetrics / Gynecology   Other Visit Diagnoses       Menopausal flushing    -  Primary   Relevant Medications   hydrochlorothiazide  (HYDRODIURIL ) 12.5 MG tablet   Other Relevant Orders   Ambulatory referral to Obstetrics / Gynecology   [facility]   Luteinizing hormone   TSH + free T4     Primary hypertension       Relevant Medications   hydrochlorothiazide  (HYDRODIURIL ) 12.5 MG tablet   Other Relevant Orders   Comprehensive Metabolic Panel (CMET)     Diabetes mellitus screening       Relevant Orders   Hemoglobin A1c     Encounter for long-term current use of medication       Relevant Orders   CBC with Differential     Encounter for hepatitis C screening test for low risk patient       Relevant Orders   Hepatitis C antibody     Breast cancer screening by mammogram        Relevant Orders   MM 3D SCREENING MAMMOGRAM BILATERAL BREAST     Family history of breast cancer       Relevant Orders   MM 3D SCREENING MAMMOGRAM BILATERAL BREAST     Encounter for immunization       Relevant Orders   Flu vaccine trivalent PF, 6mos and older(Flulaval,Afluria,Fluarix,Fluzone) (Completed)       Assessment and Plan Assessment & Plan Woman's Wellness Visit Routine wellness visit to establish care. Discussed general health maintenance, including vaccinations and screenings. - Administered flu vaccine today. - Will schedule pneumonia vaccine. - Provided paperwork for blood work. - Provided paperwork for mammogram. - Scheduled follow-up appointment in six months for annual checkup.  Menopausal and female climacteric states Experiencing menopausal symptoms. Negative pregnancy test. Currently on Zoloft , which may help with symptoms. Discussed non-hormonal options. She is concerned but not significantly bothered by symptoms. - Referred to gynecology for further evaluation and management. - Checked hormone levels as part of blood work.  Primary hypertension On medication for hypertension. Not regularly checking blood pressure but is taking medication as prescribed. - Refilled blood pressure medication.  Leiomyoma of uterus Uncertain status of uterine leiomyoma. Due for mammogram in May 2026. - Provided paperwork for mammogram. - Discussed family history of breast cancer and importance of regular screenings.    Return in about 6 months (around 02/24/2025) for Annual.    Angela MARLA Ny, MD [facility]

## 2024-08-26 NOTE — Patient Instructions (Signed)
 Call [facility] to schedule your mammogram at [phone].

## 2024-08-26 NOTE — Addendum Note (Signed)
 Addended by: SOL MACKEY SOR K on: 08/26/2024 11:08 AM   Modules accepted: Level of Service

## 2024-08-27 DIAGNOSIS — N951 Menopausal and female climacteric states: Secondary | ICD-10-CM | POA: Diagnosis not present

## 2024-08-27 DIAGNOSIS — Z1159 Encounter for screening for other viral diseases: Secondary | ICD-10-CM | POA: Diagnosis not present

## 2024-08-27 DIAGNOSIS — Z131 Encounter for screening for diabetes mellitus: Secondary | ICD-10-CM | POA: Diagnosis not present

## 2024-08-27 DIAGNOSIS — Z79899 Other long term (current) drug therapy: Secondary | ICD-10-CM | POA: Diagnosis not present

## 2024-08-27 DIAGNOSIS — I10 Essential (primary) hypertension: Secondary | ICD-10-CM | POA: Diagnosis not present

## 2024-08-28 ENCOUNTER — Ambulatory Visit: Payer: Self-pay | Admitting: Family Medicine

## 2024-08-28 LAB — CBC WITH DIFFERENTIAL/PLATELET
Basophils Absolute: 0.1 x10E3/uL (ref 0.0–0.2)
Basos: 1 %
EOS (ABSOLUTE): 0.1 x10E3/uL (ref 0.0–0.4)
Eos: 2 %
Hematocrit: 42.5 % (ref 34.0–46.6)
Hemoglobin: 13.8 g/dL (ref 11.1–15.9)
Immature Grans (Abs): 0 x10E3/uL (ref 0.0–0.1)
Immature Granulocytes: 0 %
Lymphocytes Absolute: 2.5 x10E3/uL (ref 0.7–3.1)
Lymphs: 49 %
MCH: 28.2 pg (ref 26.6–33.0)
MCHC: 32.5 g/dL (ref 31.5–35.7)
MCV: 87 fL (ref 79–97)
Monocytes Absolute: 0.6 x10E3/uL (ref 0.1–0.9)
Monocytes: 12 %
Neutrophils Absolute: 1.9 x10E3/uL (ref 1.4–7.0)
Neutrophils: 36 %
Platelets: 448 x10E3/uL (ref 150–450)
RBC: 4.9 x10E6/uL (ref 3.77–5.28)
RDW: 15.1 % (ref 11.7–15.4)
WBC: 5.1 x10E3/uL (ref 3.4–10.8)

## 2024-08-28 LAB — COMPREHENSIVE METABOLIC PANEL WITH GFR
ALT: 18 IU/L (ref 0–32)
AST: 27 IU/L (ref 0–40)
Albumin: 4.6 g/dL (ref 3.8–4.9)
Alkaline Phosphatase: 89 IU/L (ref 49–135)
BUN/Creatinine Ratio: 17 (ref 9–23)
BUN: 16 mg/dL (ref 6–24)
Bilirubin Total: 0.6 mg/dL (ref 0.0–1.2)
CO2: 25 mmol/L (ref 20–29)
Calcium: 10.1 mg/dL (ref 8.7–10.2)
Chloride: 95 mmol/L — ABNORMAL LOW (ref 96–106)
Creatinine, Ser: 0.94 mg/dL (ref 0.57–1.00)
Globulin, Total: 3.4 g/dL (ref 1.5–4.5)
Glucose: 95 mg/dL (ref 70–99)
Potassium: 4.5 mmol/L (ref 3.5–5.2)
Sodium: 135 mmol/L (ref 134–144)
Total Protein: 8 g/dL (ref 6.0–8.5)
eGFR: 73 mL/min/1.73 (ref >59)

## 2024-08-28 LAB — TSH+FREE T4
Free T4: 0.92 ng/dL (ref 0.82–1.77)
TSH: 2.16 u[IU]/mL (ref 0.450–4.500)

## 2024-08-28 LAB — HEMOGLOBIN A1C
Est. average glucose Bld gHb Est-mCnc: 114 mg/dL
Hgb A1c MFr Bld: 5.6 % (ref 4.8–5.6)

## 2024-08-28 LAB — HEPATITIS C ANTIBODY: Hep C Virus Ab: NONREACTIVE

## 2024-08-28 LAB — FOLLICLE STIMULATING HORMONE: FSH: 48.3 m[IU]/mL

## 2024-09-22 DIAGNOSIS — F329 Major depressive disorder, single episode, unspecified: Secondary | ICD-10-CM

## 2024-09-22 DIAGNOSIS — F419 Anxiety disorder, unspecified: Secondary | ICD-10-CM

## 2024-09-22 MED ORDER — SERTRALINE HCL 100 MG PO TABS: 100.0000 mg | ORAL_TABLET | Freq: Every day | ORAL | 2 refills | Status: AC

## 2024-10-26 ENCOUNTER — Encounter

## 2025-02-24 ENCOUNTER — Encounter: Admitting: Family Medicine
